# Patient Record
Sex: Male | Born: 1950 | Race: White | Hispanic: No | Marital: Single | Smoking: Former smoker
Health system: Southern US, Community
[De-identification: ages and names within clinical notes are randomized; demographics above are authoritative.]

## PROBLEM LIST (undated history)

## (undated) DIAGNOSIS — K219 Gastro-esophageal reflux disease without esophagitis: Secondary | ICD-10-CM

## (undated) DIAGNOSIS — J439 Emphysema, unspecified: Secondary | ICD-10-CM

## (undated) DIAGNOSIS — M1990 Unspecified osteoarthritis, unspecified site: Secondary | ICD-10-CM

## (undated) DIAGNOSIS — H269 Unspecified cataract: Secondary | ICD-10-CM

## (undated) DIAGNOSIS — J449 Chronic obstructive pulmonary disease, unspecified: Secondary | ICD-10-CM

## (undated) DIAGNOSIS — E039 Hypothyroidism, unspecified: Secondary | ICD-10-CM

## (undated) DIAGNOSIS — T7840XA Allergy, unspecified, initial encounter: Secondary | ICD-10-CM

## (undated) DIAGNOSIS — E785 Hyperlipidemia, unspecified: Secondary | ICD-10-CM

## (undated) HISTORY — DX: Unspecified osteoarthritis, unspecified site: M19.90

## (undated) HISTORY — PX: BRAIN SURGERY: SHX531

## (undated) HISTORY — DX: Chronic obstructive pulmonary disease, unspecified: J44.9

## (undated) HISTORY — DX: Allergy, unspecified, initial encounter: T78.40XA

## (undated) HISTORY — DX: Gastro-esophageal reflux disease without esophagitis: K21.9

## (undated) HISTORY — DX: Hyperlipidemia, unspecified: E78.5

## (undated) HISTORY — DX: Unspecified cataract: H26.9

## (undated) HISTORY — PX: EYE SURGERY: SHX253

## (undated) HISTORY — DX: Emphysema, unspecified: J43.9

## (undated) HISTORY — DX: Hypothyroidism, unspecified: E03.9

---
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
## 2000-04-12 ENCOUNTER — Encounter: Payer: Self-pay | Admitting: Family Medicine

## 2000-04-12 ENCOUNTER — Encounter: Admission: RE | Admit: 2000-04-12 | Discharge: 2000-04-12 | Payer: Self-pay | Admitting: Family Medicine

## 2006-08-21 HISTORY — PX: COLONOSCOPY: SHX174

## 2006-09-10 ENCOUNTER — Ambulatory Visit: Payer: Self-pay | Admitting: Internal Medicine

## 2006-09-17 ENCOUNTER — Ambulatory Visit: Payer: Self-pay | Admitting: Internal Medicine

## 2006-09-17 ENCOUNTER — Encounter: Payer: Self-pay | Admitting: Internal Medicine

## 2006-09-17 DIAGNOSIS — Z87891 Personal history of nicotine dependence: Secondary | ICD-10-CM | POA: Insufficient documentation

## 2006-09-17 DIAGNOSIS — E785 Hyperlipidemia, unspecified: Secondary | ICD-10-CM | POA: Insufficient documentation

## 2006-09-24 ENCOUNTER — Ambulatory Visit: Payer: Self-pay | Admitting: Internal Medicine

## 2006-10-02 ENCOUNTER — Encounter (INDEPENDENT_AMBULATORY_CARE_PROVIDER_SITE_OTHER): Payer: Self-pay | Admitting: Specialist

## 2006-10-02 ENCOUNTER — Encounter: Payer: Self-pay | Admitting: Internal Medicine

## 2006-10-02 ENCOUNTER — Ambulatory Visit: Payer: Self-pay | Admitting: Internal Medicine

## 2006-10-02 LAB — HM COLONOSCOPY

## 2006-11-13 ENCOUNTER — Ambulatory Visit: Payer: Self-pay | Admitting: Internal Medicine

## 2006-11-20 ENCOUNTER — Ambulatory Visit: Payer: Self-pay | Admitting: Internal Medicine

## 2007-05-27 ENCOUNTER — Ambulatory Visit: Payer: Self-pay | Admitting: Internal Medicine

## 2007-05-28 ENCOUNTER — Encounter: Payer: Self-pay | Admitting: Internal Medicine

## 2007-06-03 ENCOUNTER — Ambulatory Visit: Payer: Self-pay | Admitting: Internal Medicine

## 2007-09-16 ENCOUNTER — Encounter: Payer: Self-pay | Admitting: Internal Medicine

## 2007-11-21 ENCOUNTER — Ambulatory Visit: Payer: Self-pay | Admitting: Internal Medicine

## 2007-11-22 ENCOUNTER — Encounter: Payer: Self-pay | Admitting: Internal Medicine

## 2007-12-06 ENCOUNTER — Ambulatory Visit: Payer: Self-pay | Admitting: Internal Medicine

## 2008-11-26 ENCOUNTER — Ambulatory Visit: Payer: Self-pay | Admitting: Internal Medicine

## 2008-11-27 ENCOUNTER — Encounter: Payer: Self-pay | Admitting: Internal Medicine

## 2008-12-07 ENCOUNTER — Ambulatory Visit: Payer: Self-pay | Admitting: Internal Medicine

## 2009-02-18 ENCOUNTER — Encounter: Payer: Self-pay | Admitting: Internal Medicine

## 2009-04-08 ENCOUNTER — Ambulatory Visit: Payer: Self-pay | Admitting: Internal Medicine

## 2009-05-10 LAB — CONVERTED CEMR LAB: TSH: 6.107 microintl units/mL — ABNORMAL HIGH (ref 0.350–4.500)

## 2009-08-09 ENCOUNTER — Ambulatory Visit: Payer: Self-pay | Admitting: Internal Medicine

## 2009-08-10 ENCOUNTER — Encounter: Payer: Self-pay | Admitting: Internal Medicine

## 2009-08-25 ENCOUNTER — Telehealth (INDEPENDENT_AMBULATORY_CARE_PROVIDER_SITE_OTHER): Payer: Self-pay | Admitting: *Deleted

## 2009-12-07 ENCOUNTER — Ambulatory Visit: Payer: Self-pay | Admitting: Internal Medicine

## 2009-12-07 LAB — CONVERTED CEMR LAB
Bilirubin Urine: NEGATIVE
Glucose, Urine, Semiquant: NEGATIVE
Ketones, urine, test strip: NEGATIVE
Nitrite: NEGATIVE
Protein, U semiquant: NEGATIVE
Specific Gravity, Urine: 1.015
Urobilinogen, UA: 0.2
WBC Urine, dipstick: NEGATIVE
pH: 5.5

## 2009-12-08 ENCOUNTER — Encounter: Payer: Self-pay | Admitting: Internal Medicine

## 2009-12-08 LAB — CONVERTED CEMR LAB: PSA: NORMAL ng/mL

## 2009-12-14 ENCOUNTER — Ambulatory Visit: Payer: Self-pay | Admitting: Internal Medicine

## 2010-09-20 NOTE — Miscellaneous (Signed)
Summary: Health Care Power of [place] and Living Will  Health Care Power of [place] and Living Will   Imported By: Maryln Gottron 02/17/2010 11:07:52  _____________________________________________________________________  External Attachment:    Type:   Image     Comment:   External Document

## 2010-09-20 NOTE — Progress Notes (Signed)
Summary: lab results  Phone Note Call from Patient   Caller: Patient Call For: Birdie Sons MD Summary of Call: Pt is calling for last lab results. [phone] Initial call taken by: Lynann Beaver CMA,  August 25, 2009 1:53 PM  Follow-up for Phone Call        Not found in chart.  will ask lab to follow up.Gladis Riffle, RN  August 25, 2009 2:33 PM     They went to [place] and I gave the results to [place].  Appended Document: lab results Patient notified of results and need to increase levothyroxine to 125 micrograms daily.  He wants done to [facility].  Has lab appt in april to which tsh will be added.  Lab appt 4/19/ for cpx labs and tsh added.Rx done electronically.

## 2010-09-20 NOTE — Assessment & Plan Note (Signed)
Summary: cpx//lh   Vital Signs:  Patient profile:   60 year old male Height:      67.5 inches Weight:      144 pounds BMI:     22.30 Pulse rate:   88 / minute Pulse rhythm:   regular Resp:     12 per minute BP sitting:   108 / 72  (left arm) Cuff size:   regular  Vitals Entered By: Gladis Riffle, RN (December 14, 2009 8:54 AM) CC: cpx, labs done--weight loss of 12# in year Is Patient Diabetic? No   CC:  cpx and labs done--weight loss of 12# in year.  History of Present Illness: CPX  Preventive Screening-Counseling & Management  Alcohol-Tobacco     Smoking Status: current     Packs/Day: 0.75  Current Problems (verified): 1)  Physical Examination  (ICD-V70.0) 2)  Tobacco Use  (ICD-305.1) 3)  Hypothyroidism  (ICD-244.9) 4)  Hyperlipidemia  (ICD-272.4)  Current Medications (verified): 1)  Levothyroxine Sodium 125 Mcg Tabs (Levothyroxine Sodium) .... Take 1 Tablet By Mouth Once A Day  Allergies (verified): No Known Drug Allergies  Past History:  Past Medical History: Last updated: 06/03/2007  Hyperlipidemia-lab work at [facility] Hypothyroidism elevated tsh 2008  Past Surgical History: Last updated: 10/03/06 L eye surgery 1959  Family History: Last updated: 10-03-2006 mother deceased-htn pericarditis 85 yo Family History Hypertension father 80 y/o may have had mild MI 2 sisters A&W  Social History: Last updated: Oct 03, 2006 Occupation: Paramedic Current Smoker Alcohol use-no Regular exercise-no  Risk Factors: Exercise: no (October 03, 2006)  Risk Factors: Smoking Status: current (12/14/2009) Packs/Day: 0.75 (12/14/2009)  Social History: Packs/Day:  0.75  Review of Systems       All other systems reviewed and were negative    Impression & Recommendations:  Problem # 1:  PHYSICAL EXAMINATION (ICD-V70.0) health maint UTD reviewed [facility]  Problem # 2:  TOBACCO USE (ICD-305.1)  Encouraged smoking cessation and discussed different  methods for smoking cessation.   Complete Medication List: 1)  Levothyroxine Sodium 125 Mcg Tabs (Levothyroxine sodium) .... Take 1 tablet by mouth once a day  Preventive Care Screening  PSA:    Date:  12/08/2009    Results:  normal-0.6 ng/mL  Physical Exam General Appearance: well developed, well nourished, no acute distress Eyes: conjunctiva and lids normal, PERRL, EOMI, Ears, Nose, Mouth, Throat: TM clear, nares clear, oral exam WNL Neck: supple, no lymphadenopathy, no thyromegaly, no JVD Respiratory: clear to auscultation and percussion, respiratory effort normal Cardiovascular: regular rate and rhythm, S1-S2, no murmur, rub or gallop, no bruits, peripheral pulses normal and symmetric, no cyanosis, clubbing, edema or varicosities Chest: no scars, masses, tenderness; no asymmetry, skin changes, nipple discharge,    Gastrointestinal: soft, non-tender; no hepatosplenomegaly, masses; active bowel sounds all quadrants, guaiac negative stool; no masses, tenderness, hemorrhoids  Genitourinary: no hernia, testicular mass,  or prostate enlargement Lymphatic: no cervical, axillary or inguinal adenopathy Musculoskeletal: gait normal, muscle tone and strength WNL, no joint swelling, effusions, discoloration, crepitus  Skin: clear, good turgor, color WNL, no rashes, lesions, or ulcerations Neurologic: normal mental status, normal reflexes, normal strength, sensation, and motion Psychiatric: alert; oriented to person, place and time Other Exam:

## 2010-12-19 ENCOUNTER — Other Ambulatory Visit (INDEPENDENT_AMBULATORY_CARE_PROVIDER_SITE_OTHER): Payer: Self-pay | Admitting: Internal Medicine

## 2010-12-19 DIAGNOSIS — Z0000 Encounter for general adult medical examination without abnormal findings: Secondary | ICD-10-CM

## 2010-12-19 LAB — POCT URINALYSIS DIPSTICK
Glucose, UA: NEGATIVE
Leukocytes, UA: NEGATIVE
Nitrite, UA: NEGATIVE
Protein, UA: NEGATIVE
Spec Grav, UA: 1.005
Urobilinogen, UA: 0.2

## 2010-12-20 LAB — LIPID PANEL: Triglycerides: 175 mg/dL — AB (ref 40–160)

## 2010-12-23 ENCOUNTER — Encounter: Payer: Self-pay | Admitting: Internal Medicine

## 2010-12-26 ENCOUNTER — Encounter: Payer: Self-pay | Admitting: Internal Medicine

## 2010-12-26 ENCOUNTER — Ambulatory Visit (INDEPENDENT_AMBULATORY_CARE_PROVIDER_SITE_OTHER): Payer: PRIVATE HEALTH INSURANCE | Admitting: Internal Medicine

## 2010-12-26 VITALS — BP 122/76 | HR 84 | Temp 98.0°F | Ht 67.5 in | Wt 152.0 lb

## 2010-12-26 DIAGNOSIS — F17200 Nicotine dependence, unspecified, uncomplicated: Secondary | ICD-10-CM

## 2010-12-26 DIAGNOSIS — E785 Hyperlipidemia, unspecified: Secondary | ICD-10-CM

## 2010-12-26 DIAGNOSIS — Z0000 Encounter for general adult medical examination without abnormal findings: Secondary | ICD-10-CM

## 2010-12-26 DIAGNOSIS — E039 Hypothyroidism, unspecified: Secondary | ICD-10-CM

## 2010-12-26 MED ORDER — LEVOTHYROXINE SODIUM 150 MCG PO TABS: 125.0000 ug | ORAL_TABLET | Freq: Every day | ORAL | Status: DC

## 2010-12-26 NOTE — Assessment & Plan Note (Signed)
Note tsh Increase meds See new list

## 2010-12-26 NOTE — Progress Notes (Signed)
  Subjective:    Patient ID: Joseph Ford, male    DOB: [birth year], 60 y.o.   MRN: [id number]  HPI  cpx  Past Medical History  Diagnosis Date  . Hyperlipidemia   . Hypothyroidism    Past Surgical History  Procedure Date  . Left eye surgery 1959    reports that he has been smoking Cigarettes.  He has been smoking about .5 packs per day. He does not have any smokeless tobacco history on file. He reports that he drinks about 1.2 ounces of alcohol per week. He reports that he does not use illicit drugs. family history includes Cancer in his sister; Diabetes in his maternal aunt and maternal grandmother; Heart attack in his father; Heart disease in his mother; Heart disease (age of onset:42) in his father; and Hypertension in his mother. No Known Allergies   Review of Systems  patient denies chest pain, shortness of breath, orthopnea. Denies lower extremity edema, abdominal pain, change in appetite, change in bowel movements. Patient denies rashes, musculoskeletal complaints. No other specific complaints in a complete review of systems.      Objective:   Physical Exam Well-developed male in no acute distress. HEENT exam atraumatic, normocephalic, extraocular muscles are intact. Conjunctivae are pink without exudate. Neck is supple without lymphadenopathy, thyromegaly, jugular venous distention. Chest is clear to auscultation without increased work of breathing. Cardiac exam S1-S2 are regular. The PMI is normal. No significant murmurs or gallops. Abdominal exam active bowel sounds, soft, nontender. No abdominal bruits. Extremities no clubbing cyanosis or edema. Peripheral pulses are normal without bruits. Neurologic exam alert and oriented without any motor or sensory deficits. Rectal exam normal tone prostate normal size without masses or asymmetry.        Assessment & Plan:  Well visit Health maint UTD Advised smoking cessation

## 2010-12-26 NOTE — Assessment & Plan Note (Signed)
He understands need to quit He is not interested at this time Information given to patient and reviewed

## 2010-12-26 NOTE — Patient Instructions (Addendum)
Smoking Cessation This document explains the best ways for you to quit smoking and new treatments to help. It lists new medicines that can double or triple your chances of quitting and quitting for good. It also considers ways to avoid relapses and concerns you may have about quitting, including weight gain. NICOTINE: A POWERFUL ADDICTION If you have tried to quit smoking, you know how hard it can be. It is hard because nicotine is a very addictive drug. For some people, it can be as addictive as heroin or cocaine. Usually, people make 2 or 3 tries, or more, before finally being able to quit. Each time you try to quit, you can learn about what helps and what hurts. Quitting takes hard work and a lot of effort, but you can quit smoking. QUITTING SMOKING IS ONE OF THE MOST IMPORTANT THINGS YOU WILL EVER DO:  You will live longer, feel better, and live better.   The impact on your body of quitting smoking is felt almost immediately:   Within 20 minutes, blood pressure decreases. Pulse returns to its normal level.   After 8 hours, carbon monoxide levels in the blood return to normal. Oxygen level increases.   After 24 hours, chance of heart attack starts to decrease. Breath, hair, and body stop smelling like smoke.   After 48 hours, damaged nerve endings begin to recover. Sense of taste and smell improve.   After 72 hours, the body is virtually free of nicotine. Bronchial tubes relax and breathing becomes easier.   After 2 to 12 weeks, lungs can hold more air. Exercise becomes easier and circulation improves.   Quitting will lower your chance of having a heart attack, stroke, cancer, or lung disease:   After 1 year, the risk of coronary heart disease is cut in half.   After 5 years, the risk of stroke falls to the same as a nonsmoker.   After 10 years, the risk of lung cancer is cut in half and the risk of other cancers decreases significantly.   After 15 years, the risk of coronary heart  disease drops, usually to the level of a nonsmoker.   If you are pregnant, quitting smoking will improve your chances of having a healthy baby.   The people you live with, especially your children, will be healthier.   You will have extra money to spend on things other than cigarettes.  FIVE KEYS TO QUITTING Studies have shown that these 5 steps will help you quit smoking and quit for good. You have the best chances of quitting if you use them together: 1. Get ready.  2. Get support and encouragement.  3. Learn new skills and behaviors.  4. Get medicine to reduce your nicotine addiction and use it correctly.  5. Be prepared for relapse or difficult situations. Be determined to continue trying to quit, even if you do not succeed at first.  1. GET READY  Set a quit date.   Change your environment.   Get rid of ALL cigarettes, ashtrays, matches, and lighters in your home, car, and place of work.   Do not let people smoke in your home.   Review your past attempts to quit. Think about what worked and what did not.   Once you quit, do not smoke. NOT EVEN A PUFF!  2. GET SUPPORT AND ENCOURAGEMENT Studies have shown that you have a better chance of being successful if you have help. You can get support in many ways.  Tell  your family, friends, and coworkers that you are going to quit and need their support. Ask them not to smoke around you.   Talk to your caregivers (doctor, dentist, nurse, pharmacist, psychologist, and/or smoking counselor).   Get individual, group, or telephone counseling and support. The more counseling you have, the better your chances are of quitting. Programs are available at local hospitals and health centers. Call your local health department for information about programs in your area.   Spiritual beliefs and practices may help some smokers quit.   Quit meters are small computer programs online or downloadable that keep track of quit statistics, such as amount  of "quit-time," cigarettes not smoked, and money saved.   Many smokers find one or more of the many self-help books available useful in helping them quit and stay off tobacco.  3. LEARN NEW SKILLS AND BEHAVIORS  Try to distract yourself from urges to smoke. Talk to someone, go for a walk, or occupy your time with a task.   When you first try to quit, change your routine. Take a different route to work. Drink tea instead of coffee. Eat breakfast in a different place.   Do something to reduce your stress. Take a hot bath, exercise, or read a book.   Plan something enjoyable to do every day. Reward yourself for not smoking.   Explore interactive web-based programs that specialize in helping you quit.  4. GET MEDICINE AND USE IT CORRECTLY Medicines can help you stop smoking and decrease the urge to smoke. Combining medicine with the above behavioral methods and support can quadruple your chances of successfully quitting smoking. The U.S. Food and Drug Administration (FDA) has approved 7 medicines to help you quit smoking. These medicines fall into 3 categories.  Nicotine replacement therapy (delivers nicotine to your body without the negative effects and risks of smoking):   Nicotine gum: Available over-the-counter.   Nicotine lozenges: Available over-the-counter.   Nicotine inhaler: Available by prescription.   Nicotine nasal spray: Available by prescription.   Nicotine skin patches (transdermal): Available by prescription and over-the-counter.   Antidepressant medicine (helps people abstain from smoking, but how this works is unknown):   Bupropion sustained-release (SR) tablets: Available by prescription.   Nicotinic receptor partial agonist (simulates the effect of nicotine in your brain):   Varenicline tartrate tablets: Available by prescription.   Ask your caregiver for advice about which medicines to use and how to use them. Carefully read the information on the package.    Everyone who is trying to quit may benefit from using a medicine. If you are pregnant or trying to become pregnant, nursing an infant, you are under age 18, or you smoke fewer than 10 cigarettes per day, talk to your caregiver before taking any nicotine replacement medicines.   You should stop using a nicotine replacement product and call your caregiver if you experience nausea, dizziness, weakness, vomiting, fast or irregular heartbeat, mouth problems with the lozenge or gum, or redness or swelling of the skin around the patch that does not go away.   Do not use any other product containing nicotine while using a nicotine replacement product.   Talk to your caregiver before using these products if you have diabetes, heart disease, asthma, stomach ulcers, you had a recent heart attack, you have high blood pressure that is not controlled with medicine, a history of irregular heartbeat, or you have been prescribed medicine to help you quit smoking.  5. BE PREPARED FOR RELAPSE OR   DIFFICULT SITUATIONS  Most relapses occur within the first 3 months after quitting. Do not be discouraged if you start smoking again. Remember, most people try several times before they finally quit.   You may have symptoms of withdrawal because your body is used to nicotine. You may crave cigarettes, be irritable, feel very hungry, cough often, get headaches, or have difficulty concentrating.   The withdrawal symptoms are only temporary. They are strongest when you first quit, but they will go away within 10 to 14 days.  Here are some difficult situations to watch for:  Alcohol. Avoid drinking alcohol. Drinking lowers your chances of successfully quitting.   Caffeine. Try to reduce the amount of caffeine you consume. It also lowers your chances of successfully quitting.   Other smokers. Being around smoking can make you want to smoke. Avoid smokers.   Weight gain. Many smokers will gain weight when they quit, usually  less than 10 pounds. Eat a healthy diet and stay active. Do not let weight gain distract you from your main goal, quitting smoking. Some medicines that help you quit smoking may also help delay weight gain. You can always lose the weight gained after you quit.   Bad mood or depression. There are a lot of ways to improve your mood other than smoking.  If you are having problems with any of these situations, talk to your caregiver. SPECIAL SITUATIONS OR CONDITIONS Studies suggest that everyone can quit smoking. Your situation or condition can give you a special reason to quit.  Pregnant women/New mothers: By quitting, you protect your baby's health and your own.   Hospitalized patients: By quitting, you reduce health problems and help healing.   Heart attack patients: By quitting, you reduce your risk of a second heart attack.   Lung, head, and neck cancer patients: By quitting, you reduce your chance of a second cancer.   Parents of children and adolescents: By quitting, you protect your children from illnesses caused by secondhand smoke.  QUESTIONS TO THINK ABOUT Think about the following questions before you try to stop smoking. You may want to talk about your answers with your caregiver.  Why do you want to quit?   If you tried to quit in the past, what helped and what did not?   What will be the most difficult situations for you after you quit? How will you plan to handle them?   Who can help you through the tough times? Your family? Friends? Caregiver?   What pleasures do you get from smoking? What ways can you still get pleasure if you quit?  Here are some questions to ask your caregiver:  How can you help me to be successful at quitting?   What medicine do you think would be best for me and how should I take it?   What should I do if I need more help?   What is smoking withdrawal like? How can I get information on withdrawal?  Quitting takes hard work and a lot of effort,  but you can quit smoking. FOR MORE INFORMATION Smokefree.gov (http://www.davis-sullivan.com/) provides free, accurate, evidence-based information and professional assistance to help support the immediate and long-term needs of people trying to quit smoking. Document Released: 08/01/2001 Document Re-Released: 01/25/2010 [facility] Patient Information 2011 [place], [place].Cholesterol Cholesterol is a white, waxy, fat-like protein needed by your body in small amounts. The liver makes all the cholesterol you need. It is carried from the liver by the blood through the blood vessels.  Deposits (plaque) may build up on blood vessel walls. This makes the arteries narrower and stiffer. Plaque increases the risk for heart attack and stroke. You cannot feel your cholesterol level even if it is very high. The only way to know is by a blood test to check your lipid (fats) levels. Once you know your cholesterol levels, you should keep a record of the test results. Work with your caregiver to to keep your levels in the desired range. WHAT THE RESULTS MEAN:  Total cholesterol is a rough measure of all the cholesterol in your blood.   LDL is the so-called bad cholesterol. This is the type that deposits cholesterol   in the walls of the arteries. You want this level to be low.   HDL is the good cholesterol because it cleans the arteries and carries the LDL away. You want this level to be high.   Triglycerides are fat that the body can either burn for energy or store. High levels are closely linked to heart disease.  DESIRED LEVELS:  Total cholesterol below 200.   LDL below 100 for people at risk, below 70 for very high risk.   HDL above 50 is good, above 60 is best.   Triglycerides below 150.  HOW TO LOWER YOUR CHOLESTEROL:  Diet.   Choose fish or white meat chicken and [place], roasted or baked. Limit fatty cuts of red meat, fried foods, and processed meats, such as sausage and lunch meat.   Eat lots of fresh  fruits and vegetables. Choose whole grains, beans, pasta, potatoes and cereals.   Use only small amounts of olive, corn or canola oils. Avoid butter, mayonnaise, shortening or palm kernel oils. Avoid foods with trans-fats.   Use skim/nonfat milk and low-fat/nonfat yogurt and cheeses. Avoid whole milk, cream, ice cream, egg yolks and cheeses. Healthy desserts include angel food cake, gingersnaps, animal crackers, hard candy, popsicles, and low-fat/nonfat frozen yogurt. Avoid pastries, cakes, pies and cookies.   Exercise.   A regular program helps decrease LDL and raises HDL.   Helps with weight control.   Do things that increase your activity level like gardening, walking, or taking the stairs.   Medication.   May be prescribed by your caregiver to help lowering cholesterol and the risk for heart disease.   You may need medicine even if your levels are normal if you have several risk factors.  HOME CARE INSTRUCTIONS  Follow your diet and exercise programs as suggested by your caregiver.   Take medications as directed.   Have blood work done when your caregiver feels it is necessary.  MAKE SURE YOU:   Understand these instructions.   Will watch your condition.   Will get help right away if you are not doing well or get worse.  Document Released: 05/02/2001 Document Re-Released: 07/20/2008 [facility] Patient Information 2011 [place], [place].

## 2011-01-02 ENCOUNTER — Encounter: Payer: Self-pay | Admitting: Internal Medicine

## 2011-01-10 ENCOUNTER — Encounter: Payer: Self-pay | Admitting: Internal Medicine

## 2011-04-25 ENCOUNTER — Other Ambulatory Visit: Payer: PRIVATE HEALTH INSURANCE

## 2011-04-25 DIAGNOSIS — E785 Hyperlipidemia, unspecified: Secondary | ICD-10-CM

## 2011-04-25 DIAGNOSIS — E039 Hypothyroidism, unspecified: Secondary | ICD-10-CM

## 2011-05-02 ENCOUNTER — Ambulatory Visit: Payer: PRIVATE HEALTH INSURANCE | Admitting: Internal Medicine

## 2011-05-08 ENCOUNTER — Ambulatory Visit: Payer: PRIVATE HEALTH INSURANCE | Admitting: Internal Medicine

## 2011-05-09 ENCOUNTER — Ambulatory Visit (INDEPENDENT_AMBULATORY_CARE_PROVIDER_SITE_OTHER): Payer: PRIVATE HEALTH INSURANCE | Admitting: Internal Medicine

## 2011-05-09 ENCOUNTER — Encounter: Payer: Self-pay | Admitting: Internal Medicine

## 2011-05-09 DIAGNOSIS — E785 Hyperlipidemia, unspecified: Secondary | ICD-10-CM

## 2011-05-09 DIAGNOSIS — E039 Hypothyroidism, unspecified: Secondary | ICD-10-CM

## 2011-05-17 NOTE — Assessment & Plan Note (Signed)
Resists treatment

## 2011-05-17 NOTE — Assessment & Plan Note (Signed)
Will increase meds Continue same

## 2011-05-17 NOTE — Progress Notes (Signed)
  Subjective:    Patient ID: Joseph Ford, male    DOB: 1950/12/11, 60 y.o.   MRN: [id number]  HPI  Patient Active Problem List  Diagnoses  . HYPOTHYROIDISM---tolerating replacement therapy  . HYPERLIPIDEMIA---no meds at this time  . TOBACCO USE----not interested in quitting   Past Medical History  Diagnosis Date  . Hyperlipidemia   . Hypothyroidism    Past Surgical History  Procedure Date  . Left eye surgery 1959    reports that he has been smoking Cigarettes.  He has been smoking about .5 packs per day. He does not have any smokeless tobacco history on file. He reports that he drinks about 1.2 ounces of alcohol per week. He reports that he does not use illicit drugs. family history includes Cancer in his sister; Diabetes in his maternal aunt and maternal grandmother; Heart attack in his father; Heart disease in his mother; Heart disease (age of onset:42) in his father; and Hypertension in his mother. No Known Allergies  Review of Systems  patient denies chest pain, shortness of breath, orthopnea. Denies lower extremity edema, abdominal pain, change in appetite, change in bowel movements. Patient denies rashes, musculoskeletal complaints. No other specific complaints in a complete review of systems.      Objective:   Physical Exam   well-developed well-nourished male in no acute distress. HEENT exam atraumatic, normocephalic, neck supple without jugular venous distention. Chest clear to auscultation cardiac exam S1-S2 are regular. Abdominal exam overweight with bowel sounds, soft and nontender. Extremities no edema. Neurologic exam is alert with a normal gait.       Assessment & Plan:

## 2011-12-25 ENCOUNTER — Telehealth: Payer: Self-pay | Admitting: Internal Medicine

## 2011-12-25 DIAGNOSIS — E039 Hypothyroidism, unspecified: Secondary | ICD-10-CM

## 2011-12-25 MED ORDER — LEVOTHYROXINE SODIUM 150 MCG PO TABS: 125.0000 ug | ORAL_TABLET | Freq: Every day | ORAL | Status: DC

## 2011-12-25 NOTE — Telephone Encounter (Signed)
rx sent in electronically 

## 2011-12-25 NOTE — Telephone Encounter (Signed)
Upon scheduling his cpx patient informed me that he will run out of his levothyroxine rx before his cpx 01/24/12. Please assist.

## 2012-01-17 ENCOUNTER — Other Ambulatory Visit (INDEPENDENT_AMBULATORY_CARE_PROVIDER_SITE_OTHER): Payer: PRIVATE HEALTH INSURANCE

## 2012-01-17 DIAGNOSIS — Z0000 Encounter for general adult medical examination without abnormal findings: Secondary | ICD-10-CM

## 2012-01-18 ENCOUNTER — Telehealth: Payer: Self-pay | Admitting: *Deleted

## 2012-01-18 LAB — LIPID PANEL
LDL Cholesterol: 168 mg/dL
Triglycerides: 108 mg/dL (ref 40–160)

## 2012-01-18 NOTE — Telephone Encounter (Signed)
They received urine cup for urinalysis but states it is not the appropriate collection. Please call and let them know what they should do? Reference  # [id number] L.

## 2012-01-18 NOTE — Telephone Encounter (Signed)
[facility] is going to send urine back for [place] to run a u/a on.

## 2012-01-19 LAB — POCT URINALYSIS DIPSTICK
Ketones, UA: NEGATIVE
Protein, UA: NEGATIVE
Spec Grav, UA: <=1.005

## 2012-01-23 ENCOUNTER — Telehealth: Payer: Self-pay | Admitting: Internal Medicine

## 2012-01-23 NOTE — Telephone Encounter (Signed)
Pt called and said that he is coming in to see Dr Cato Mulligan tomorrow morning and needs the nurse to pull the lab work pt had done at [facility] last wk.

## 2012-01-23 NOTE — Telephone Encounter (Signed)
Labs were faxed to [place]

## 2012-01-24 ENCOUNTER — Encounter: Payer: Self-pay | Admitting: Internal Medicine

## 2012-01-24 ENCOUNTER — Ambulatory Visit (INDEPENDENT_AMBULATORY_CARE_PROVIDER_SITE_OTHER): Payer: PRIVATE HEALTH INSURANCE | Admitting: Internal Medicine

## 2012-01-24 VITALS — BP 110/74 | HR 88 | Temp 97.9°F | Ht 67.0 in | Wt 150.0 lb

## 2012-01-24 DIAGNOSIS — E785 Hyperlipidemia, unspecified: Secondary | ICD-10-CM

## 2012-01-24 MED ORDER — ATORVASTATIN CALCIUM 20 MG PO TABS: 20.0000 mg | ORAL_TABLET | Freq: Every day | ORAL | Status: DC

## 2012-01-24 NOTE — Assessment & Plan Note (Signed)
Not controlled and he has multiple risk factors: age, smoker, Fhx Start statin

## 2012-01-24 NOTE — Progress Notes (Signed)
  Subjective:    Patient ID: Joseph Ford, male    DOB: [birth year], 61 y.o.   MRN: [id number]  HPI  cpx  Past Medical History  Diagnosis Date  . Hyperlipidemia   . Hypothyroidism     History   Social History  . Marital Status: Single    Spouse Name: N/A    Number of Children: N/A  . Years of Education: N/A   Occupational History  . Not on file.   Social History Main Topics  . Smoking status: Current Everyday Smoker -- 0.5 packs/day    Types: Cigarettes  . Smokeless tobacco: Not on file  . Alcohol Use: 1.2 oz/week    2 Glasses of wine per week  . Drug Use: No  . Sexually Active: Not on file   Other Topics Concern  . Not on file   Social History Narrative  . No narrative on file    Past Surgical History  Procedure Date  . Left eye surgery 1959    Family History  Problem Relation Age of Onset  . Hypertension Mother   . Heart disease Mother   . Heart attack Father   . Heart disease Father 36    "mild heart attack"  . Diabetes Maternal Aunt   . Diabetes Maternal Grandmother   . Cancer Sister     breast    No Known Allergies  Current Outpatient Prescriptions on File Prior to Visit  Medication Sig Dispense Refill  . levothyroxine (SYNTHROID, LEVOTHROID) 150 MCG tablet Take 1 tablet (150 mcg total) by mouth daily.  90 tablet  0  . atorvastatin (LIPITOR) 20 MG tablet Take 1 tablet (20 mg total) by mouth daily.  90 tablet  3     patient denies chest pain, shortness of breath, orthopnea. Denies lower extremity edema, abdominal pain, change in appetite, change in bowel movements. Patient denies rashes, musculoskeletal complaints. No other specific complaints in a complete review of systems.   BP 110/74  Pulse 88  Temp(Src) 97.9 F (36.6 C) (Oral)  Ht 5\' 7"  (1.702 m)  Wt 150 lb (68.04 kg)  BMI 23.49 kg/m2   Review of Systems     Objective:   Physical Exam Well-developed male in no acute distress. HEENT exam atraumatic, normocephalic, extraocular  muscles are intact. Conjunctivae are pink without exudate. Neck is supple without lymphadenopathy, thyromegaly, jugular venous distention. Chest is clear to auscultation without increased work of breathing. Cardiac exam S1-S2 are regular. The PMI is normal. No significant murmurs or gallops. Abdominal exam active bowel sounds, soft, nontender. No abdominal bruits. Extremities no clubbing cyanosis or edema. Peripheral pulses are normal without bruits. Neurologic exam alert and oriented without any motor or sensory deficits. Rectal exam normal tone prostate normal size without masses or asymmetry.        Assessment & Plan:  Well visit: health maint UTD

## 2012-01-25 ENCOUNTER — Encounter: Payer: Self-pay | Admitting: Internal Medicine

## 2012-03-21 ENCOUNTER — Other Ambulatory Visit: Payer: Self-pay | Admitting: Internal Medicine

## 2012-04-25 ENCOUNTER — Other Ambulatory Visit (INDEPENDENT_AMBULATORY_CARE_PROVIDER_SITE_OTHER): Payer: PRIVATE HEALTH INSURANCE

## 2012-04-25 DIAGNOSIS — E785 Hyperlipidemia, unspecified: Secondary | ICD-10-CM

## 2012-05-17 ENCOUNTER — Encounter: Payer: Self-pay | Admitting: Internal Medicine

## 2012-09-16 ENCOUNTER — Other Ambulatory Visit: Payer: Self-pay | Admitting: Internal Medicine

## 2012-10-05 ENCOUNTER — Other Ambulatory Visit: Payer: Self-pay

## 2012-12-16 ENCOUNTER — Other Ambulatory Visit: Payer: Self-pay | Admitting: Internal Medicine

## 2013-01-13 ENCOUNTER — Other Ambulatory Visit: Payer: Self-pay | Admitting: Internal Medicine

## 2013-01-15 ENCOUNTER — Encounter: Payer: Self-pay | Admitting: Internal Medicine

## 2013-03-20 ENCOUNTER — Other Ambulatory Visit: Payer: Self-pay | Admitting: Internal Medicine

## 2013-03-20 MED ORDER — LEVOTHYROXINE SODIUM 150 MCG PO TABS: ORAL_TABLET | ORAL | Status: DC

## 2013-03-28 ENCOUNTER — Other Ambulatory Visit (INDEPENDENT_AMBULATORY_CARE_PROVIDER_SITE_OTHER): Payer: PRIVATE HEALTH INSURANCE

## 2013-03-28 DIAGNOSIS — Z0000 Encounter for general adult medical examination without abnormal findings: Secondary | ICD-10-CM

## 2013-03-28 LAB — POCT URINALYSIS DIPSTICK
Ketones, UA: NEGATIVE
Protein, UA: NEGATIVE
Spec Grav, UA: 1.02
Urobilinogen, UA: 0.2
pH, UA: 5.5

## 2013-03-28 LAB — BASIC METABOLIC PANEL: Glucose: 101 mg/dL

## 2013-03-28 LAB — TSH: TSH: 0.56 u[IU]/mL (ref <=5.90)

## 2013-03-28 LAB — LIPID PANEL
HDL: 44 mg/dL (ref 35–70)
LDL Cholesterol: 74 mg/dL

## 2013-04-04 ENCOUNTER — Ambulatory Visit (INDEPENDENT_AMBULATORY_CARE_PROVIDER_SITE_OTHER): Payer: PRIVATE HEALTH INSURANCE | Admitting: Internal Medicine

## 2013-04-04 ENCOUNTER — Encounter: Payer: Self-pay | Admitting: Internal Medicine

## 2013-04-04 VITALS — BP 104/68 | HR 88 | Temp 98.0°F | Ht 67.75 in | Wt 158.0 lb

## 2013-04-04 DIAGNOSIS — Z2911 Encounter for prophylactic immunotherapy for respiratory syncytial virus (RSV): Secondary | ICD-10-CM

## 2013-04-04 DIAGNOSIS — Z23 Encounter for immunization: Secondary | ICD-10-CM

## 2013-04-04 DIAGNOSIS — Z0000 Encounter for general adult medical examination without abnormal findings: Secondary | ICD-10-CM

## 2013-04-04 NOTE — Progress Notes (Signed)
cpx  Past Medical History  Diagnosis Date  . Hyperlipidemia   . Hypothyroidism     History   Social History  . Marital Status: Single    Spouse Name: N/A    Number of Children: N/A  . Years of Education: N/A   Occupational History  . Not on file.   Social History Main Topics  . Smoking status: Current Every Day Smoker -- 0.50 packs/day    Types: Cigarettes  . Smokeless tobacco: Not on file  . Alcohol Use: 1.2 oz/week    2 Glasses of wine per week  . Drug Use: No  . Sexual Activity: Not on file   Other Topics Concern  . Not on file   Social History Narrative  . No narrative on file    Past Surgical History  Procedure Laterality Date  . Left eye surgery  1959    Family History  Problem Relation Age of Onset  . Hypertension Mother   . Heart disease Mother   . Heart attack Father   . Heart disease Father 70    "mild heart attack"  . Diabetes Maternal Aunt   . Diabetes Maternal Grandmother   . Cancer Sister     breast    No Known Allergies  Current Outpatient Prescriptions on File Prior to Visit  Medication Sig Dispense Refill  . atorvastatin (LIPITOR) 20 MG tablet TAKE ONE TABLET BY MOUTH EVERY DAY  90 tablet  0  . levothyroxine (SYNTHROID, LEVOTHROID) 150 MCG tablet TAKE ONE TABLET BY MOUTH EVERY DAY.  30 tablet  0   No current facility-administered medications on file prior to visit.     patient denies chest pain, shortness of breath, orthopnea. Denies lower extremity edema, abdominal pain, change in appetite, change in bowel movements. Patient denies rashes, musculoskeletal complaints. No other specific complaints in a complete review of systems.   Reviewed vitals  well-developed well-nourished male in no acute distress. HEENT exam atraumatic, normocephalic, neck supple without jugular venous distention. Chest clear to auscultation cardiac exam S1-S2 are regular. Abdominal exam overweight with bowel sounds, soft and nontender. Extremities no edema.  Neurologic exam is alert with a normal gait.  Well Visit- health maint utd Lipids and tsh well controlled

## 2013-04-11 ENCOUNTER — Encounter: Payer: PRIVATE HEALTH INSURANCE | Admitting: Internal Medicine

## 2013-04-16 ENCOUNTER — Other Ambulatory Visit: Payer: Self-pay | Admitting: Internal Medicine

## 2013-04-16 MED ORDER — ATORVASTATIN CALCIUM 20 MG PO TABS: ORAL_TABLET | ORAL | Status: DC

## 2013-04-16 MED ORDER — LEVOTHYROXINE SODIUM 150 MCG PO TABS: ORAL_TABLET | ORAL | Status: DC

## 2013-06-26 ENCOUNTER — Other Ambulatory Visit: Payer: Self-pay

## 2013-09-16 ENCOUNTER — Encounter: Payer: Self-pay | Admitting: Internal Medicine

## 2014-03-12 ENCOUNTER — Encounter: Payer: Self-pay | Admitting: Internal Medicine

## 2014-03-15 MED ORDER — ATORVASTATIN CALCIUM 20 MG PO TABS: 10.0000 mg | ORAL_TABLET | Freq: Every day | ORAL | Status: DC

## 2014-03-15 NOTE — Telephone Encounter (Signed)
Ok to decrease atorvastatin to 10 mg daily

## 2014-04-22 ENCOUNTER — Other Ambulatory Visit: Payer: Self-pay | Admitting: Internal Medicine

## 2014-05-11 ENCOUNTER — Encounter: Payer: Self-pay | Admitting: Family Medicine

## 2014-05-11 ENCOUNTER — Ambulatory Visit (INDEPENDENT_AMBULATORY_CARE_PROVIDER_SITE_OTHER): Payer: PRIVATE HEALTH INSURANCE | Admitting: Family Medicine

## 2014-05-11 VITALS — BP 100/70 | HR 80 | Temp 98.0°F | Wt 162.0 lb

## 2014-05-11 DIAGNOSIS — E039 Hypothyroidism, unspecified: Secondary | ICD-10-CM

## 2014-05-11 DIAGNOSIS — F17200 Nicotine dependence, unspecified, uncomplicated: Secondary | ICD-10-CM

## 2014-05-11 DIAGNOSIS — Z23 Encounter for immunization: Secondary | ICD-10-CM

## 2014-05-11 DIAGNOSIS — E785 Hyperlipidemia, unspecified: Secondary | ICD-10-CM

## 2014-05-11 NOTE — Assessment & Plan Note (Signed)
Advised strongly of need for cessation. Patient not currently interested. 1/2 ppd.

## 2014-05-11 NOTE — Assessment & Plan Note (Addendum)
Well controlled previously. Decreased atorvastatin to 1/2 of 20mg  pill by phone several months ago. Recheck lipids today.

## 2014-05-11 NOTE — Patient Instructions (Addendum)
[place] Maintenance Due  Topic Date Due  . Influenza Vaccine  03/21/2014   Labs today  You opted to check prostate every 2 years.   Start a baby aspirin.   Strongly encourage you to quit smoking.

## 2014-05-11 NOTE — Progress Notes (Signed)
Joseph Reddish, MD Phone: [phone]  Subjective:  Patient presents today to establish care with me as their new primary care provider. Patient was formerly a patient of Dr. Leanne Chang. Chief complaint-noted.   Hyperlipidemia-well controlled  Lab Results  Component Value Date   LDLCALC 74 03/28/2013  On statin: atorvastatin 20mg -takes 1/2 a pill once a day Regular exercise: advised, no Diet: good choices ROS- no chest pain or shortness of breath. Slight soreness in low back muscles which improved on lower dose  Hypothyroidism-well controlled  On thyroid medication-levothyroxine 164mcg ROS-No hair or nail changes. No heat/cold intolerance. No constipation or diarrhea. Denies shakiness or anxiety. No palpitations. Gained 4 lbs in a year  Tobacco abuse-poor controlled 1/2 PPD. Contemplative. Quit in 59 for short period of time while still working. Had been to smoking cessation class.  ROS- admits occasional mild shortness of breath, thinks allergy symptoms worse while smoking  The following were reviewed and entered/updated in epic: Past Medical History  Diagnosis Date  . Hyperlipidemia   . Hypothyroidism    Patient Active Problem List   Diagnosis Date Noted  . TOBACCO USE 09/17/2006    Priority: High  . HYPOTHYROIDISM 09/17/2006    Priority: Medium  . HYPERLIPIDEMIA 09/17/2006    Priority: Medium   Past Surgical History  Procedure Laterality Date  . Left eye surgery  1959    at [facility] eyes    Family History  Problem Relation Age of Onset  . Hypertension Mother   . Heart disease Mother 40    died of heart attack  . Heart attack Father   . Heart disease Father 11    "mild heart attack"  . Diabetes Maternal Aunt   . Diabetes Maternal Grandmother   . Cancer Sister     breast  . Thyroid disease Sister     unremoved, unclear reason    Medications- reviewed and updated Current Outpatient Prescriptions  Medication Sig Dispense Refill  . atorvastatin (LIPITOR)  20 MG tablet Take 0.5 tablets (10 mg total) by mouth daily. TAKE ONE TABLET BY MOUTH EVERY DAY  90 tablet  3  . levothyroxine (SYNTHROID, LEVOTHROID) 150 MCG tablet TAKE ONE TABLET BY MOUTH ONCE DAILY  90 tablet  0   No current facility-administered medications for this visit.    Allergies-reviewed and updated No Known Allergies  History   Social History  . Marital Status: Single    Spouse Name: N/A    Number of Children: N/A  . Years of Education: N/A   Social History Main Topics  . Smoking status: Current Every Day Smoker -- 0.50 packs/day    Types: Cigarettes  . Smokeless tobacco: None  . Alcohol Use: 0.6 oz/week    1 Glasses of wine per week  . Drug Use: No  . Sexual Activity: None   Other Topics Concern  . None   Social History Narrative   Divorced. No children.    Lives with sister and husband who live in his basement. Has another sister that lives with him in his house.       Retired from post office.       Hobbies: yardwork, Architect work             ROS--See HPI   Objective: BP 100/70  Pulse 80  Temp(Src) 98 F (36.7 C)  Wt 162 lb (73.483 kg) Gen: NAD, resting comfortably in chair HEENT: Mucous membranes are moist. Oropharynx normal Neck: no thyromegaly CV: RRR no murmurs rubs or  gallops Lungs: CTAB no crackles, wheeze, rhonchi Abdomen: soft/nontender/nondistended/normal bowel sounds. No rebound or guarding.  Ext: no edema, 2+ DP pulses Skin: warm, dry, no AK noted (patient bald and advised regular hat/sunscreen which he already uses) Neuro: grossly normal, moves all extremities, PERRLA  Assessment/Plan:  HYPERLIPIDEMIA Well controlled previously. Decreased atorvastatin to 1/2 of 20mg  pill by phone several months ago. Recheck lipids today.   HYPOTHYROIDISM Well controlled previously. Check tsh today, continue levothyroxine 139mcg.   TOBACCO USE Advised strongly of need for cessation. Patient not currently interested. 1/2 ppd.    Baby  ASA-advised to start given HLD and tobacco abuse.  Patient opts for q 2 year prostate DRE and PSA  nonfasting Orders Placed This Encounter  Procedures  . TSH    [place]  . LDL cholesterol, direct    [place]  . Comprehensive metabolic panel    [place]

## 2014-05-11 NOTE — Assessment & Plan Note (Signed)
Well controlled previously. Check tsh today, continue levothyroxine 170mcg.

## 2014-05-12 ENCOUNTER — Telehealth: Payer: Self-pay | Admitting: Family Medicine

## 2014-05-12 DIAGNOSIS — E039 Hypothyroidism, unspecified: Secondary | ICD-10-CM

## 2014-05-12 LAB — COMPREHENSIVE METABOLIC PANEL
ALT: 27 U/L (ref 0–53)
AST: 17 U/L (ref 0–37)
Albumin: 4.6 g/dL (ref 3.5–5.2)
Alkaline Phosphatase: 57 U/L (ref 39–117)
BUN: 12 mg/dL (ref 6–23)
CALCIUM: 10.2 mg/dL (ref 8.4–10.5)
CHLORIDE: 101 meq/L (ref 96–112)
CO2: 27 mEq/L (ref 19–32)
Creat: 0.88 mg/dL (ref 0.50–1.35)
Glucose, Bld: 90 mg/dL (ref 70–99)
POTASSIUM: 4.3 meq/L (ref 3.5–5.3)
Sodium: 139 mEq/L (ref 135–145)
TOTAL PROTEIN: 7.3 g/dL (ref 6.0–8.3)
Total Bilirubin: 0.4 mg/dL (ref 0.2–1.2)

## 2014-05-12 LAB — LDL CHOLESTEROL, DIRECT: LDL DIRECT: 97 mg/dL

## 2014-05-12 LAB — TSH: TSH: 0.063 u[IU]/mL — AB (ref 0.350–4.500)

## 2014-05-12 MED ORDER — LEVOTHYROXINE SODIUM 137 MCG PO TABS: ORAL_TABLET | ORAL | Status: DC

## 2014-05-12 NOTE — Assessment & Plan Note (Signed)
Levothyroxine 179mcg-->137 mcg due to low TSH Lab Results  Component Value Date   TSH 0.063* 05/11/2014

## 2014-05-12 NOTE — Telephone Encounter (Signed)
See problem list

## 2014-11-08 ENCOUNTER — Encounter: Payer: Self-pay | Admitting: Family Medicine

## 2014-11-09 ENCOUNTER — Other Ambulatory Visit: Payer: Self-pay

## 2014-11-09 DIAGNOSIS — E039 Hypothyroidism, unspecified: Secondary | ICD-10-CM

## 2014-11-09 MED ORDER — LEVOTHYROXINE SODIUM 137 MCG PO TABS: ORAL_TABLET | ORAL | Status: DC

## 2014-11-09 MED ORDER — ATORVASTATIN CALCIUM 20 MG PO TABS: 10.0000 mg | ORAL_TABLET | Freq: Every day | ORAL | Status: DC

## 2014-12-31 ENCOUNTER — Ambulatory Visit (INDEPENDENT_AMBULATORY_CARE_PROVIDER_SITE_OTHER): Payer: PRIVATE HEALTH INSURANCE | Admitting: Family Medicine

## 2014-12-31 ENCOUNTER — Encounter: Payer: Self-pay | Admitting: Family Medicine

## 2014-12-31 VITALS — BP 110/68 | HR 96 | Temp 98.1°F | Wt 159.0 lb

## 2014-12-31 DIAGNOSIS — M940 Chondrocostal junction syndrome [Tietze]: Secondary | ICD-10-CM | POA: Diagnosis not present

## 2014-12-31 DIAGNOSIS — Z720 Tobacco use: Secondary | ICD-10-CM

## 2014-12-31 DIAGNOSIS — R079 Chest pain, unspecified: Secondary | ICD-10-CM | POA: Diagnosis not present

## 2014-12-31 DIAGNOSIS — F17200 Nicotine dependence, unspecified, uncomplicated: Secondary | ICD-10-CM

## 2014-12-31 DIAGNOSIS — R0789 Other chest pain: Secondary | ICD-10-CM | POA: Diagnosis not present

## 2014-12-31 DIAGNOSIS — E039 Hypothyroidism, unspecified: Secondary | ICD-10-CM

## 2014-12-31 LAB — TSH: TSH: 0.251 u[IU]/mL — ABNORMAL LOW (ref 0.350–4.500)

## 2014-12-31 MED ORDER — MELOXICAM 15 MG PO TABS: 15.0000 mg | ORAL_TABLET | Freq: Every day | ORAL | Status: DC

## 2014-12-31 NOTE — Patient Instructions (Addendum)
I have a very low suspicion that this is related to your heart but I do not like your family history in regards to this. For this reason I want close follow up if you do not improve over the next month.  Plan -stop leaning on that left side for 30 minutes before bed -Take meloxicam for 10 days -ice area 3x a day for 30 minutes then switch to heat at least once a day  Ok to continue manipulation for now, may have to reconsider in future  Check blood for thyroid on way out  Costochondritis Costochondritis, sometimes called Tietze syndrome, is a swelling and irritation (inflammation) of the tissue (cartilage) that connects your ribs with your breastbone (sternum). It causes pain in the chest and rib area. Costochondritis usually goes away on its own over time. It can take up to 6 weeks or longer to get better, especially if you are unable to limit your activities. CAUSES  Some cases of costochondritis have no known cause. Possible causes include:  Injury (trauma).  Exercise or activity such as lifting.  Severe coughing. SIGNS AND SYMPTOMS  Pain and tenderness in the chest and rib area.  Pain that gets worse when coughing or taking deep breaths.  Pain that gets worse with specific movements. DIAGNOSIS  Your health care provider will do a physical exam and ask about your symptoms. Chest X-rays or other tests may be done to rule out other problems. TREATMENT  Costochondritis usually goes away on its own over time. Your health care provider may prescribe medicine to help relieve pain. HOME CARE INSTRUCTIONS   Avoid exhausting physical activity. Try not to strain your ribs during normal activity. This would include any activities using chest, abdominal, and side muscles, especially if heavy weights are used.  Apply ice to the affected area for the first 2 days after the pain begins.  Put ice in a plastic bag.  Place a towel between your skin and the bag.  Leave the ice on for 20  minutes, 2-3 times a day.  Only take over-the-counter or prescription medicines as directed by your health care provider. SEEK MEDICAL CARE IF:  You have redness or swelling at the rib joints. These are signs of infection.  Your pain does not go away despite rest or medicine. SEEK IMMEDIATE MEDICAL CARE IF:   Your pain increases or you are very uncomfortable.  You have shortness of breath or difficulty breathing.  You cough up blood.  You have worse chest pains, sweating, or vomiting.  You have a fever or persistent symptoms for more than 2-3 days.  You have a fever and your symptoms suddenly get worse. MAKE SURE YOU:   Understand these instructions.  Will watch your condition.  Will get help right away if you are not doing well or get worse. Document Released: 05/17/2005 Document Revised: 05/28/2013 Document Reviewed: 03/11/2013 [facility] Patient Information 2015 [place], [place]. This information is not intended to replace advice given to you by your health care provider. Make sure you discuss any questions you have with your health care provider.

## 2014-12-31 NOTE — Progress Notes (Signed)
Garret Reddish, MD  Subjective:  Joseph Ford is a 64 y.o. year old very pleasant male patient who presents with:  Atypical chest pain Pain to left of lower sternum about 6 months ago. Thought initially just like a pain that would go away in a week. Pain comes and goes. Lasts a few minutes when it occurs usually Seems like he has to stretch to make it pop and then improves. If cannot get it to pop, lasts longer in the day. Happens 2-3x a day. Usually happens with position changes like getting up in bed, pulling shower curtain back, turning with driving. Never occurs with exertion like going up stairs or walking quickly or walking up a hill.  Symptoms not increasing in frequency. Pain at max 7/10 aching. Not increasing in frequency. Worse with cough and sneeze.   Also has pain in his back between his shoulder blades sometimes with this.   ROS-No shortness of breath, nausea, diaphoresis. No left arm or neck pain.   Past Medical History- Risk factors 1/2 PPD smoking, hyperlipidemia Family history- strong family history with mom with MI 67, father MI 53  Medications- reviewed and updated Current Outpatient Prescriptions  Medication Sig Dispense Refill  . aspirin 81 MG tablet Take 81 mg by mouth daily.    Marland Kitchen atorvastatin (LIPITOR) 20 MG tablet Take 0.5 tablets (10 mg total) by mouth daily. TAKE ONE TABLET BY MOUTH EVERY DAY 90 tablet 3  . levothyroxine (SYNTHROID, LEVOTHROID) 137 MCG tablet TAKE ONE TABLET BY MOUTH ONCE DAILY 90 tablet 3   Objective: BP 110/68 mmHg  Pulse 96  Temp(Src) 98.1 F (36.7 C)  Wt 159 lb (72.122 kg) Gen: NAD, resting comfortably CV: RRR no murmurs rubs or gallops Chest wall tenderness along likely rib # 5 at insertion at sternum, though cannot reproduce back pain Lungs: CTAB no crackles, wheeze, rhonchi Abdomen: soft/nontender/nondistended/normal bowel sounds. No rebound or guarding.  Ext: no edema Skin: warm, dry, no rash over chest    EKG: Rate 94, sinus  rhythm, normal axis, normal intervals, no obvious hypertrophy, no st or t wave changes (compared to EKG 12/06/2007)   Assessment/Plan:  Atypical chest pain suspect costochondritis but risk factors concerning Atypical for cardiac pain (not exertional, not improved by rest, aching pains for a few minutes that resolve with "popping" back into place) regardless though with risk factors of smoking, HLD, family history MI mom 57, dad 73. EKG without ischemic changes. Pain also reproducible on exam, worse with cough or sneeze. I strongly suspect this is costochondritis. His nighttime positioning may promote this and we will stop that as well as use short course meloxicam. We discussed strict follow up and planned follow up in a month if no improvement.    1 month.   Orders Placed This Encounter  Procedures  . TSH  . EKG 12-Lead    Meds ordered this encounter  Medications  . meloxicam (MOBIC) 15 MG tablet    Sig: Take 1 tablet (15 mg total) by mouth daily.    Dispense:  10 tablet    Refill:  0

## 2014-12-31 NOTE — Assessment & Plan Note (Addendum)
Atypical for cardiac pain (not exertional, not improved by rest, aching pains for a few minutes that resolve with "popping" back into place) regardless though with risk factors of smoking, HLD, family history MI mom 38, dad 71. EKG without ischemic changes. Pain also reproducible on exam, worse with cough or sneeze. I strongly suspect this is costochondritis. His nighttime positioning may promote this and we will stop that as well as use short course meloxicam. We discussed strict follow up and planned follow up in a month if no improvement.

## 2015-01-01 ENCOUNTER — Other Ambulatory Visit: Payer: Self-pay | Admitting: Family Medicine

## 2015-01-01 DIAGNOSIS — E039 Hypothyroidism, unspecified: Secondary | ICD-10-CM

## 2015-01-01 MED ORDER — LEVOTHYROXINE SODIUM 125 MCG PO TABS: ORAL_TABLET | ORAL | Status: DC

## 2015-01-04 NOTE — Addendum Note (Signed)
Addended by: Clyde Lundborg A on: 01/04/2015 08:24 AM   Modules accepted: Orders

## 2015-02-18 ENCOUNTER — Other Ambulatory Visit (INDEPENDENT_AMBULATORY_CARE_PROVIDER_SITE_OTHER): Payer: PRIVATE HEALTH INSURANCE

## 2015-02-18 DIAGNOSIS — E039 Hypothyroidism, unspecified: Secondary | ICD-10-CM

## 2015-02-19 LAB — TSH: TSH: 0.923 u[IU]/mL (ref 0.350–4.500)

## 2015-06-04 ENCOUNTER — Other Ambulatory Visit (INDEPENDENT_AMBULATORY_CARE_PROVIDER_SITE_OTHER): Payer: PRIVATE HEALTH INSURANCE

## 2015-06-04 DIAGNOSIS — Z0000 Encounter for general adult medical examination without abnormal findings: Secondary | ICD-10-CM | POA: Diagnosis not present

## 2015-06-04 LAB — CBC WITH DIFFERENTIAL/PLATELET
BASOS PCT: 0 % (ref 0–1)
Basophils Absolute: 0 10*3/uL (ref 0.0–0.1)
Eosinophils Absolute: 0.2 10*3/uL (ref 0.0–0.7)
Eosinophils Relative: 3 % (ref 0–5)
HCT: 39.1 % (ref 39.0–52.0)
HEMOGLOBIN: 13.9 g/dL (ref 13.0–17.0)
Lymphocytes Relative: 32 % (ref 12–46)
Lymphs Abs: 2.2 10*3/uL (ref 0.7–4.0)
MCH: 32 pg (ref 26.0–34.0)
MCHC: 35.5 g/dL (ref 30.0–36.0)
MCV: 90.1 fL (ref 78.0–100.0)
MONO ABS: 0.5 10*3/uL (ref 0.1–1.0)
MPV: 9.4 fL (ref 8.6–12.4)
Monocytes Relative: 7 % (ref 3–12)
NEUTROS ABS: 4.1 10*3/uL (ref 1.7–7.7)
NEUTROS PCT: 58 % (ref 43–77)
PLATELETS: 283 10*3/uL (ref 150–400)
RBC: 4.34 MIL/uL (ref 4.22–5.81)
RDW: 13.6 % (ref 11.5–15.5)
WBC: 7 10*3/uL (ref 4.0–10.5)

## 2015-06-05 LAB — HEPATIC FUNCTION PANEL
ALBUMIN: 4 g/dL (ref 3.6–5.1)
ALK PHOS: 47 U/L (ref 40–115)
ALT: 18 U/L (ref 9–46)
AST: 13 U/L (ref 10–35)
BILIRUBIN INDIRECT: 0.4 mg/dL (ref 0.2–1.2)
BILIRUBIN TOTAL: 0.5 mg/dL (ref 0.2–1.2)
Bilirubin, Direct: 0.1 mg/dL (ref <=0.2)
Total Protein: 6.5 g/dL (ref 6.1–8.1)

## 2015-06-05 LAB — URINALYSIS
Bilirubin Urine: NEGATIVE
GLUCOSE, UA: NEGATIVE
Ketones, ur: NEGATIVE
LEUKOCYTES UA: NEGATIVE
NITRITE: NEGATIVE
PH: 5.5 (ref 5.0–8.0)
Protein, ur: NEGATIVE
SPECIFIC GRAVITY, URINE: 1.016 (ref 1.001–1.035)

## 2015-06-05 LAB — PSA: PSA: 1.23 ng/mL (ref <=4.00)

## 2015-06-05 LAB — BASIC METABOLIC PANEL
BUN: 14 mg/dL (ref 7–25)
CALCIUM: 9.5 mg/dL (ref 8.6–10.3)
CO2: 27 mmol/L (ref 20–31)
Chloride: 104 mmol/L (ref 98–110)
Creat: 1 mg/dL (ref 0.70–1.25)
GLUCOSE: 97 mg/dL (ref 65–99)
Potassium: 4.3 mmol/L (ref 3.5–5.3)
SODIUM: 139 mmol/L (ref 135–146)

## 2015-06-05 LAB — LIPID PANEL
CHOL/HDL RATIO: 4.5 ratio (ref <=5.0)
CHOLESTEROL: 156 mg/dL (ref 125–200)
HDL: 35 mg/dL — ABNORMAL LOW (ref >=40)
LDL Cholesterol: 100 mg/dL (ref <130)
TRIGLYCERIDES: 106 mg/dL (ref <150)
VLDL: 21 mg/dL (ref <30)

## 2015-06-05 LAB — TSH: TSH: 1.677 u[IU]/mL (ref 0.350–4.500)

## 2015-06-11 ENCOUNTER — Encounter: Payer: Self-pay | Admitting: Family Medicine

## 2015-06-11 ENCOUNTER — Ambulatory Visit (INDEPENDENT_AMBULATORY_CARE_PROVIDER_SITE_OTHER): Payer: PRIVATE HEALTH INSURANCE | Admitting: Internal Medicine

## 2015-06-11 VITALS — BP 110/70 | HR 93 | Temp 98.1°F | Resp 16 | Ht 68.0 in | Wt 160.0 lb

## 2015-06-11 DIAGNOSIS — Z23 Encounter for immunization: Secondary | ICD-10-CM

## 2015-06-11 DIAGNOSIS — E039 Hypothyroidism, unspecified: Secondary | ICD-10-CM

## 2015-06-11 DIAGNOSIS — E785 Hyperlipidemia, unspecified: Secondary | ICD-10-CM

## 2015-06-11 DIAGNOSIS — Z0000 Encounter for general adult medical examination without abnormal findings: Secondary | ICD-10-CM

## 2015-06-11 DIAGNOSIS — F17200 Nicotine dependence, unspecified, uncomplicated: Secondary | ICD-10-CM

## 2015-06-11 NOTE — Progress Notes (Signed)
Subjective:    Patient ID: Joseph Ford, male    DOB: 1951/01/18, 64 y.o.   MRN: [id number]  HPI  64 year old patient who is seen today for a preventive health examination.  Medical problems include dyslipidemia, hypothyroidism, and ongoing tobacco use Colonoscopy 2008   Past Medical History  Diagnosis Date  . Hyperlipidemia   . Hypothyroidism     Social History   Social History  . Marital Status: Single    Spouse Name: N/A  . Number of Children: N/A  . Years of Education: N/A   Occupational History  . Not on file.   Social History Main Topics  . Smoking status: Current Every Day Smoker -- 0.50 packs/day    Types: Cigarettes  . Smokeless tobacco: Not on file  . Alcohol Use: 0.6 oz/week    1 Glasses of wine per week  . Drug Use: No  . Sexual Activity: Not on file   Other Topics Concern  . Not on file   Social History Narrative   Divorced. No children.    Lives with sister and husband who live in his basement. Has another sister that lives with him in his house.       Retired from post office.       Hobbies: yardwork, Architect work             Past Surgical History  Procedure Laterality Date  . Left eye surgery  1959    at [facility] eyes    Family History  Problem Relation Age of Onset  . Hypertension Mother   . Heart disease Mother 10    died of heart attack  . Heart attack Father   . Heart disease Father 52    "mild heart attack"  . Diabetes Maternal Aunt   . Diabetes Maternal Grandmother   . Cancer Sister     breast  . Thyroid disease Sister     unremoved, unclear reason    No Known Allergies  Current Outpatient Prescriptions on File Prior to Visit  Medication Sig Dispense Refill  . aspirin 81 MG tablet Take 81 mg by mouth daily.    Marland Kitchen atorvastatin (LIPITOR) 20 MG tablet Take 0.5 tablets (10 mg total) by mouth daily. TAKE ONE TABLET BY MOUTH EVERY DAY 90 tablet 3  . levothyroxine (SYNTHROID, LEVOTHROID) 125 MCG tablet TAKE  ONE TABLET BY MOUTH ONCE DAILY 90 tablet 3  . meloxicam (MOBIC) 15 MG tablet Take 1 tablet (15 mg total) by mouth daily. 10 tablet 0   No current facility-administered medications on file prior to visit.    BP 110/70 mmHg  Pulse 93  Temp(Src) 98.1 F (36.7 C)  Resp 16  Ht 5\' 8"  (1.727 m)  Wt 160 lb (72.576 kg)  BMI 24.33 kg/m2  SpO2 96%     Review of Systems  Constitutional: Negative for fever, chills, activity change, appetite change and fatigue.  HENT: Negative for congestion, dental problem, ear pain, hearing loss, mouth sores, rhinorrhea, sinus pressure, sneezing, tinnitus, trouble swallowing and voice change.   Eyes: Negative for photophobia, pain, redness and visual disturbance.  Respiratory: Negative for apnea, cough, choking, chest tightness, shortness of breath and wheezing.   Cardiovascular: Negative for chest pain, palpitations and leg swelling.  Gastrointestinal: Negative for nausea, vomiting, abdominal pain, diarrhea, constipation, blood in stool, abdominal distention, anal bleeding and rectal pain.  Genitourinary: Negative for dysuria, urgency, frequency, hematuria, flank pain, decreased urine volume, discharge, penile swelling, scrotal swelling, difficulty urinating,  genital sores and testicular pain.  Musculoskeletal: Negative for myalgias, back pain, joint swelling, arthralgias, gait problem, neck pain and neck stiffness.  Skin: Negative for color change, rash and wound.  Neurological: Negative for dizziness, tremors, seizures, syncope, facial asymmetry, speech difficulty, weakness, light-headedness, numbness and headaches.  Hematological: Negative for adenopathy. Does not bruise/bleed easily.  Psychiatric/Behavioral: Negative for suicidal ideas, hallucinations, behavioral problems, confusion, sleep disturbance, self-injury, dysphoric mood, decreased concentration and agitation. The patient is not nervous/anxious.        Objective:   Physical Exam    Constitutional: He appears well-developed and well-nourished.  Blood pressure low normal  HENT:  Head: Normocephalic and atraumatic.  Right Ear: External ear normal.  Left Ear: External ear normal.  Nose: Nose normal.  Mouth/Throat: Oropharynx is clear and moist.  Eyes: Conjunctivae and EOM are normal. Pupils are equal, round, and reactive to light. No scleral icterus.  Neck: Normal range of motion. Neck supple. No JVD present. No thyromegaly present.  Cardiovascular: Regular rhythm, normal heart sounds and intact distal pulses.  Exam reveals no gallop and no friction rub.   No murmur heard. Pulmonary/Chest: Effort normal and breath sounds normal. He exhibits no tenderness.  Abdominal: Soft. Bowel sounds are normal. He exhibits no distension and no mass. There is no tenderness.  Genitourinary: Prostate normal and penis normal. Guaiac negative stool.  Musculoskeletal: Normal range of motion. He exhibits no edema or tenderness.  Lymphadenopathy:    He has no cervical adenopathy.  Neurological: He is alert. He has normal reflexes. No cranial nerve deficit. Coordination normal.  Skin: Skin is warm and dry. No rash noted.  Psychiatric: He has a normal mood and affect. His behavior is normal.          Assessment & Plan:   Preventive health examination Hypothyroidism.  Continue levothyroxine supplementation Dyslipidemia.  Continue statin therapy Ongoing tobacco use.  Total smoking cessation encouraged

## 2015-06-11 NOTE — Patient Instructions (Signed)

## 2015-11-26 ENCOUNTER — Other Ambulatory Visit: Payer: Self-pay | Admitting: *Deleted

## 2015-11-26 MED ORDER — ATORVASTATIN CALCIUM 20 MG PO TABS: 10.0000 mg | ORAL_TABLET | Freq: Every day | ORAL | Status: DC

## 2015-12-23 ENCOUNTER — Other Ambulatory Visit: Payer: Self-pay | Admitting: Family Medicine

## 2016-01-25 ENCOUNTER — Other Ambulatory Visit: Payer: Self-pay | Admitting: Family Medicine

## 2016-04-20 ENCOUNTER — Ambulatory Visit (INDEPENDENT_AMBULATORY_CARE_PROVIDER_SITE_OTHER): Payer: No Typology Code available for payment source | Admitting: Podiatry

## 2016-04-20 VITALS — BP 124/86 | HR 105 | Wt 160.0 lb

## 2016-04-20 DIAGNOSIS — B351 Tinea unguium: Secondary | ICD-10-CM | POA: Diagnosis not present

## 2016-04-20 DIAGNOSIS — L600 Ingrowing nail: Secondary | ICD-10-CM | POA: Diagnosis not present

## 2016-04-20 NOTE — Progress Notes (Signed)
   Subjective:    Patient ID: Joseph Ford, male    DOB: [birth year], 65 y.o.   MRN: [id number]  HPI  I think I have an ingrown toe nail on my right 1st toe.  The nail seems to be thickening and has been touchy for 6-8 weeks.     Review of Systems  HENT: Positive for tinnitus.        Objective:   Physical Exam        Assessment & Plan:

## 2016-04-20 NOTE — Patient Instructions (Signed)

## 2016-04-20 NOTE — Progress Notes (Signed)
Subjective:     Patient ID: Joseph Ford, male   DOB: 1951/03/25, 65 y.o.   MRN: [id number]  HPI patient presents stating he's had a painful ingrown toenail of his left big toe and it's hard for him to wear shoe gear comfortably. Tried to trim it and soak it without relief   Review of Systems  All other systems reviewed and are negative.      Objective:   Physical Exam  Constitutional: He is oriented to person, place, and time.  Cardiovascular: Intact distal pulses.   Musculoskeletal: Normal range of motion.  Neurological: He is oriented to person, place, and time.  Skin: Skin is warm.  Nursing note and vitals reviewed.  neurovascular status intact muscle strength adequate range of motion within normal limits with patient noted to have inflammatory changes the left hallux medial border with incurvation of the nailbed and pain when palpated. Patient has good digital perfusion and is well oriented 3 and has some yellow discoloration of his nailbeds     Assessment:     Ingrown toenail deformity left hallux medial border that's painful when pressed    Plan:     H&P condition reviewed and recommended correction. Explained the procedure and risk and today I infiltrated the left hallux 60 [place] Xylocaine Marcaine mixture remove the corner exposed matrix and applied phenol 3 applications 30 seconds followed by alcohol lavage and sterile dressing. Gave instructions on soaks and reappoint

## 2016-05-15 ENCOUNTER — Telehealth: Payer: Self-pay | Admitting: *Deleted

## 2016-05-15 NOTE — Telephone Encounter (Signed)
Called patient at [phone] (Home #) to check to see how they were doing from their ingrown toenail procedure that was performed on Thursday, April 20, 2016. Pt's sister stated, "He's doing pretty good and not in any pain".

## 2016-06-06 ENCOUNTER — Other Ambulatory Visit (INDEPENDENT_AMBULATORY_CARE_PROVIDER_SITE_OTHER): Payer: No Typology Code available for payment source

## 2016-06-06 DIAGNOSIS — R319 Hematuria, unspecified: Secondary | ICD-10-CM

## 2016-06-06 DIAGNOSIS — Z0000 Encounter for general adult medical examination without abnormal findings: Secondary | ICD-10-CM | POA: Diagnosis not present

## 2016-06-06 LAB — POC URINALSYSI DIPSTICK (AUTOMATED)
BILIRUBIN UA: NEGATIVE
Glucose, UA: NEGATIVE
KETONES UA: NEGATIVE
LEUKOCYTES UA: NEGATIVE
Nitrite, UA: NEGATIVE
PH UA: 5.5
Protein, UA: NEGATIVE
Spec Grav, UA: 1.02
Urobilinogen, UA: 0.2

## 2016-06-06 LAB — CBC WITH DIFFERENTIAL/PLATELET
BASOS PCT: 0 %
Basophils Absolute: 0 cells/uL (ref 0–200)
EOS PCT: 4 %
Eosinophils Absolute: 236 cells/uL (ref 15–500)
HEMATOCRIT: 40.1 % (ref 38.5–50.0)
Hemoglobin: 13.8 g/dL (ref 13.2–17.1)
LYMPHS PCT: 40 %
Lymphs Abs: 2360 cells/uL (ref 850–3900)
MCH: 31.5 pg (ref 27.0–33.0)
MCHC: 34.4 g/dL (ref 32.0–36.0)
MCV: 91.6 fL (ref 80.0–100.0)
MONO ABS: 472 {cells}/uL (ref 200–950)
MONOS PCT: 8 %
MPV: 9.3 fL (ref 7.5–12.5)
NEUTROS PCT: 48 %
Neutro Abs: 2832 cells/uL (ref 1500–7800)
PLATELETS: 279 10*3/uL (ref 140–400)
RBC: 4.38 MIL/uL (ref 4.20–5.80)
RDW: 13.6 % (ref 11.0–15.0)
WBC: 5.9 10*3/uL (ref 3.8–10.8)

## 2016-06-06 LAB — URINALYSIS, MICROSCOPIC ONLY: RBC / HPF: NONE SEEN (ref 0–?)

## 2016-06-06 LAB — TSH: TSH: 2.51 mIU/L (ref 0.40–4.50)

## 2016-06-07 LAB — HEPATIC FUNCTION PANEL
ALT: 17 U/L (ref 9–46)
AST: 13 U/L (ref 10–35)
Albumin: 4.1 g/dL (ref 3.6–5.1)
Alkaline Phosphatase: 44 U/L (ref 40–115)
BILIRUBIN DIRECT: 0.1 mg/dL (ref <=0.2)
BILIRUBIN INDIRECT: 0.3 mg/dL (ref 0.2–1.2)
BILIRUBIN TOTAL: 0.4 mg/dL (ref 0.2–1.2)
Total Protein: 6.7 g/dL (ref 6.1–8.1)

## 2016-06-07 LAB — LIPID PANEL
CHOL/HDL RATIO: 3.7 ratio (ref <=5.0)
CHOLESTEROL: 140 mg/dL (ref 125–200)
HDL: 38 mg/dL — AB (ref >=40)
LDL CALC: 81 mg/dL (ref <130)
TRIGLYCERIDES: 104 mg/dL (ref <150)
VLDL: 21 mg/dL (ref <30)

## 2016-06-07 LAB — BASIC METABOLIC PANEL
BUN: 16 mg/dL (ref 7–25)
CHLORIDE: 104 mmol/L (ref 98–110)
CO2: 24 mmol/L (ref 20–31)
CREATININE: 0.94 mg/dL (ref 0.70–1.25)
Calcium: 9.2 mg/dL (ref 8.6–10.3)
GLUCOSE: 96 mg/dL (ref 65–99)
Potassium: 4.2 mmol/L (ref 3.5–5.3)
Sodium: 139 mmol/L (ref 135–146)

## 2016-06-07 LAB — PSA: PSA: 1 ng/mL (ref <=4.0)

## 2016-06-16 ENCOUNTER — Ambulatory Visit (INDEPENDENT_AMBULATORY_CARE_PROVIDER_SITE_OTHER): Payer: No Typology Code available for payment source | Admitting: Family Medicine

## 2016-06-16 ENCOUNTER — Encounter: Payer: Self-pay | Admitting: Family Medicine

## 2016-06-16 VITALS — BP 132/80 | HR 88 | Temp 97.8°F | Ht 68.25 in | Wt 164.4 lb

## 2016-06-16 DIAGNOSIS — F17200 Nicotine dependence, unspecified, uncomplicated: Secondary | ICD-10-CM | POA: Diagnosis not present

## 2016-06-16 DIAGNOSIS — Z1283 Encounter for screening for malignant neoplasm of skin: Secondary | ICD-10-CM | POA: Diagnosis not present

## 2016-06-16 DIAGNOSIS — E785 Hyperlipidemia, unspecified: Secondary | ICD-10-CM

## 2016-06-16 DIAGNOSIS — Z0000 Encounter for general adult medical examination without abnormal findings: Secondary | ICD-10-CM

## 2016-06-16 DIAGNOSIS — R0789 Other chest pain: Secondary | ICD-10-CM | POA: Diagnosis not present

## 2016-06-16 DIAGNOSIS — Z23 Encounter for immunization: Secondary | ICD-10-CM | POA: Diagnosis not present

## 2016-06-16 MED ORDER — LEVOTHYROXINE SODIUM 125 MCG PO TABS: 125.0000 ug | ORAL_TABLET | Freq: Every day | ORAL | 3 refills | Status: DC

## 2016-06-16 MED ORDER — ATORVASTATIN CALCIUM 20 MG PO TABS: 10.0000 mg | ORAL_TABLET | Freq: Every day | ORAL | 1 refills | Status: DC

## 2016-06-16 NOTE — Addendum Note (Signed)
Addended by: Lucianne Lei M on: 06/16/2016 10:29 AM   Modules accepted: Orders

## 2016-06-16 NOTE — Progress Notes (Signed)
Pre visit review using our clinic review tool, if applicable. No additional management support is needed unless otherwise documented below in the visit note. 

## 2016-06-16 NOTE — Progress Notes (Signed)
Phone: [phone]  Subjective:  Patient presents today for their annual physical. Chief complaint-noted.   See problem oriented charting- ROS- full  review of systems was completed and negative except for occasional chest pain relieved when gets up and hears "pop", denies SOB, diaphoresis, nausea, left arm or neck pain  The following were reviewed and entered/updated in epic: Past Medical History:  Diagnosis Date  . Hyperlipidemia   . Hypothyroidism    Patient Active Problem List   Diagnosis Date Noted  . Atypical chest pain suspect costochondritis but risk factors concerning 12/31/2014    Priority: High  . TOBACCO USE 09/17/2006    Priority: High  . Hypothyroidism 12/31/2014    Priority: Medium  . Dyslipidemia 09/17/2006    Priority: Medium   Past Surgical History:  Procedure Laterality Date  . left eye surgery  1959   at [facility]    Family History  Problem Relation Age of Onset  . Hypertension Mother   . Heart disease Mother 55    died of heart attack  . Heart attack Father   . Heart disease Father 55    "mild heart attack"  . Diabetes Maternal Aunt   . Diabetes Maternal Grandmother   . Cancer Sister     breast  . Thyroid disease Sister     unremoved, unclear reason    Medications- reviewed and updated Current Outpatient Prescriptions  Medication Sig Dispense Refill  . atorvastatin (LIPITOR) 20 MG tablet Take 0.5 tablets (10 mg total) by mouth daily. 90 tablet 1  . ibuprofen (ADVIL,MOTRIN) 200 MG tablet Take 200 mg by mouth 2 (two) times daily.    Marland Kitchen levothyroxine (SYNTHROID, LEVOTHROID) 125 MCG tablet TAKE ONE TABLET BY MOUTH ONCE DAILY 30 tablet 6   No current facility-administered medications for this visit.     Allergies-reviewed and updated No Known Allergies  Social History   Social History  . Marital status: Single    Spouse name: N/A  . Number of children: N/A  . Years of education: N/A   Social History Main Topics  . Smoking  status: Current Every Day Smoker    Packs/day: 0.50    Types: Cigarettes  . Smokeless tobacco: None  . Alcohol use 0.6 oz/week    1 Glasses of wine per week  . Drug use: No  . Sexual activity: Not Asked   Other Topics Concern  . None   Social History Narrative   Divorced. No children.    Lives with sister and husband who live in his basement. Has another sister that lives with him in his house.       Retired from post office.       Hobbies: yardwork, Architect work             Objective: BP 132/80 (BP Location: Left Arm, Patient Position: Sitting, Cuff Size: Normal)   Pulse 88   Temp 97.8 F (36.6 C) (Oral)   Ht 5' 8.25" (1.734 m)   Wt 164 lb 6.4 oz (74.6 kg)   SpO2 95%   BMI 24.81 kg/m  Gen: NAD, resting comfortably HEENT: Mucous membranes are moist. Oropharynx normal Neck: no thyromegaly CV: RRR no murmurs rubs or gallops Lungs: CTAB no crackles, wheeze, rhonchi Abdomen: soft/nontender/nondistended/normal bowel sounds. No rebound or guarding.  Ext: no edema Skin: warm, dry Neuro: grossly normal, moves all extremities, PERRLA Rectal: normal tone, upper end of normal sized prostate, no masses or tenderness   Assessment/Plan:  65 y.o. male presenting  for annual physical.  Health Maintenance counseling: 1. Anticipatory guidance: Patient counseled regarding regular dental exams, eye exams, wearing seatbelts.  2. Risk factor reduction:  Advised patient of need for regular exercise and diet rich and fruits and vegetables to reduce risk of heart attack and stroke. No regular routine- pretty active- though does use push mower/weed eat etc. Advised 150 minutes exercise per week.  3. Immunizations/screenings/ancillary studies Immunization History  Administered Date(s) Administered  . Influenza,inj,Quad PF,36+ Mos 05/11/2014  . Influenza,inj,quad, With Preservative 06/11/2015  . Pneumococcal Polysaccharide-23 04/04/2013  . Td 09/17/2006  . Zoster 04/04/2013    Health Maintenance Due  Topic Date Due  . Hepatitis C Screening - next labs 10/04/52  . HIV Screening - next labs 03/21/1968  . INFLUENZA VACCINE - today 03/21/2016   4. Prostate cancer screening- low risk based off PSA trend and rectal exam  Lab Results  Component Value Date   PSA 1.0 06/06/2016   PSA 1.23 06/04/2015   PSA 0.9 12/20/2010   5. Colon cancer screening - 10/02/2006 with 10 year follow up. Hyperplastic polyps.  6. Skin cancer screening- refer today under screening for skin cancer  Status of chronic or acute concerns  HLD- atorvastatin 20mg  1/2 tablet, well controlled. Advised push exercise to increase HDL Lab Results  Component Value Date   CHOL 140 06/06/2016   HDL 38 (L) 06/06/2016   LDLCALC 81 06/06/2016   LDLDIRECT 97 05/11/2014   TRIG 104 06/06/2016   CHOLHDL 3.7 06/06/2016   Hypothyroidism- controlled on levothyroxine 125 mcg Lab Results  Component Value Date   TSH 2.51 06/06/2016   Smoking- 1/2 PPD, 50 years- 2 packs a day in past. At least 30 pack years. AAA screen at 30. Agrees to lung cancer screening program. Advised to quit- not ready  Atypical chest pain suspect costochondritis but risk factors concerning Patient still with some intermittent chest pain- a lot of times even just with sitting- when he gets up from seated position- hears a "pop" and pain goes away. Pushing push mower- no issues and denies issues with exertion- likely not cardiac  1 year CPE unless concerns about thyroid  Orders Placed This Encounter  Procedures  . Ambulatory referral to Dermatology    Referral Priority:   Routine    Referral Type:   Consultation    Referral Reason:   Specialty Services Required    Requested Specialty:   Dermatology    Number of Visits Requested:   1  . Ambulatory Referral for Lung Cancer Scre    Referral Priority:   Routine    Referral Type:   Consultation    Referral Reason:   Specialty Services Required    Number of Visits Requested:   1     Meds ordered this encounter  Medications  . atorvastatin (LIPITOR) 20 MG tablet    Sig: Take 0.5 tablets (10 mg total) by mouth daily.    Dispense:  100 tablet    Refill:  1  . levothyroxine (SYNTHROID, LEVOTHROID) 125 MCG tablet    Sig: Take 1 tablet (125 mcg total) by mouth daily.    Dispense:  100 tablet    Refill:  3    Return precautions advised.   Garret Reddish, MD

## 2016-06-16 NOTE — Patient Instructions (Addendum)
We will call you within a week about your referral to dermatology and lung cancer screening program. If you do not hear within 2 weeks, give [place] a call.   Consider hepatitis C and HIV screen next labs  Thanks for getting your flu shot  Work on getting exercise up to 150 minutes a week

## 2016-06-16 NOTE — Assessment & Plan Note (Signed)
Patient still with some intermittent chest pain- a lot of times even just with sitting- when he gets up from seated position- hears a "pop" and pain goes away. Pushing push mower- no issues and denies issues with exertion- likely not cardiac

## 2016-07-07 ENCOUNTER — Encounter: Payer: Self-pay | Admitting: Family Medicine

## 2016-07-11 ENCOUNTER — Other Ambulatory Visit: Payer: Self-pay

## 2016-07-11 DIAGNOSIS — F17200 Nicotine dependence, unspecified, uncomplicated: Secondary | ICD-10-CM

## 2016-07-11 DIAGNOSIS — Z1283 Encounter for screening for malignant neoplasm of skin: Secondary | ICD-10-CM

## 2016-09-01 ENCOUNTER — Other Ambulatory Visit: Payer: Self-pay | Admitting: Acute Care

## 2016-09-01 DIAGNOSIS — F17210 Nicotine dependence, cigarettes, uncomplicated: Secondary | ICD-10-CM

## 2016-09-08 ENCOUNTER — Encounter: Payer: Self-pay | Admitting: Acute Care

## 2016-09-08 ENCOUNTER — Ambulatory Visit (INDEPENDENT_AMBULATORY_CARE_PROVIDER_SITE_OTHER): Payer: No Typology Code available for payment source | Admitting: Acute Care

## 2016-09-08 ENCOUNTER — Ambulatory Visit (INDEPENDENT_AMBULATORY_CARE_PROVIDER_SITE_OTHER)
Admission: RE | Admit: 2016-09-08 | Discharge: 2016-09-08 | Disposition: A | Discharge status: Home / Self Care | Payer: No Typology Code available for payment source | Source: Ambulatory Visit | Attending: Acute Care | Admitting: Acute Care

## 2016-09-08 DIAGNOSIS — F17210 Nicotine dependence, cigarettes, uncomplicated: Secondary | ICD-10-CM | POA: Diagnosis not present

## 2016-09-08 DIAGNOSIS — R918 Other nonspecific abnormal finding of lung field: Secondary | ICD-10-CM

## 2016-09-08 NOTE — Progress Notes (Signed)
Shared Decision Making Visit Lung Cancer Screening Program [phone])   Eligibility:  Age 66 y.o.  Pack Years Smoking History Calculation 42.5 pack years (# packs/per year x # years smoked)  Recent History of coughing up blood  no  Unexplained weight loss? no ( >Than 15 pounds within the last 6 months )  Prior History Lung / other cancer no (Diagnosis within the last 5 years already requiring surveillance chest CT Scans).  Smoking Status Current Smoker  Former Smokers: Years since quit:NA  Quit Date: NA  Visit Components:  Discussion included one or more decision making aids. yes  Discussion included risk/benefits of screening. yes  Discussion included potential follow up diagnostic testing for abnormal scans. yes  Discussion included meaning and risk of over diagnosis. yes  Discussion included meaning and risk of False Positives. yes  Discussion included meaning of total radiation exposure. yes  Counseling Included:  Importance of adherence to annual lung cancer LDCT screening. yes  Impact of comorbidities on ability to participate in the program. yes  Ability and willingness to under diagnostic treatment. yes  Smoking Cessation Counseling:  Current Smokers:   Discussed importance of smoking cessation. yes  Information about tobacco cessation classes and interventions provided to patient. yes  Patient provided with "ticket" for LDCT Scan. yes  Symptomatic Patient. no   Diagnosis Code: Tobacco Use Z72.0  Asymptomatic Patient yes  Counseling (Intermediate counseling: > three minutes counseling) [id number]  Former Smokers:   Discussed the importance of maintaining cigarette abstinence. yes  Diagnosis Code: Personal History of Nicotine Dependence. [id number]  Information about tobacco cessation classes and interventions provided to patient. Yes  Patient provided with "ticket" for LDCT Scan. yes  Written Order for Lung Cancer Screening with LDCT placed in  Epic. Yes (CT Chest Lung Cancer Screening Low Dose W/O CM) [id number] Z12.2-Screening of respiratory organs Z87.891-Personal history of nicotine dependence  I have spent 20 minutes of face to face time with Joseph Ford discussing the risks and benefits of lung cancer screening. We viewed a power point together that explained in detail the above noted topics. We paused at intervals to allow for questions to be asked and answered to ensure understanding.We discussed that the single most powerful action that he can take to decrease his risk of developing lung cancer is to quit smoking. We discussed whether or not he is ready to commit to setting a quit date.He is not ready to commit to a quit date, but is contemplating ways to quit, cut down smoking. We discussed options for tools to aid in quitting smoking including nicotine replacement therapy, non-nicotine medications, support groups, Quit Smart classes, and behavior modification. We discussed that often times setting smaller, more achievable goals, such as eliminating 1 cigarette a day for a week and then 2 cigarettes a day for a week can be helpful in slowly decreasing the number of cigarettes smoked. This allows for a sense of accomplishment as well as providing a clinical benefit. I gave him  the " Be Stronger Than Your Excuses" card with contact information for community resources, classes, free nicotine replacement therapy, and access to mobile apps, text messaging, and on-line smoking cessation help. I have also given him my card and contact information in the event he needs to contact me. We discussed the time and location of the scan, and that either Joseph Ford, CMA, or I will call with the results within 24-48 hours of receiving them. I have provided him with a copy of the  power point we viewed  as a resource in the event they need reinforcement of the concepts we discussed today in the office. The patient verbalized understanding of all of  the above  and had no further questions upon leaving the office. They have my contact information in the event they have any further questions.  We discussed the high incidence of CAD noted on these exams. The patient is being treated with Lipitor per his PCP. He is aware this may be noted on this exam. I explained that I will share the results of the scan with his PCP for completeness of his medical record. He verbalized understanding.   Magdalen Spatz, NP 09/08/2016

## 2016-09-11 ENCOUNTER — Telehealth: Payer: Self-pay

## 2016-09-11 NOTE — Telephone Encounter (Signed)
I will call the patient. Thanks so much.

## 2016-09-11 NOTE — Telephone Encounter (Signed)
Received call report from [place] with CT. Below I have copied the impression. Eric Form please advise. Thanks.   IMPRESSION: 1. Favor asymmetric subpleural scarring in the right upper lobe. However, by size criteria, lesion is categorized as Lung-RADS Category 4B, suspicious. Repeat low-dose CT chest without contrast is recommended in 3 months. These results will be called to the ordering clinician or representative by the Radiologist Assistant, and communication documented in the PACS or zVision Dashboard. 2. Aortic atherosclerosis (ICD10-170.0). Coronary artery calcification.

## 2016-09-13 ENCOUNTER — Telehealth: Payer: Self-pay | Admitting: Acute Care

## 2016-09-13 DIAGNOSIS — F17210 Nicotine dependence, cigarettes, uncomplicated: Secondary | ICD-10-CM

## 2016-09-13 NOTE — Telephone Encounter (Signed)
I have called Joseph Ford with the results of his low dose CT screening scan. I explained that his scan was read as a Lung RADS 4 B indicating suspicious findings for which additional diagnostic testing and or tissue sampling is recommended.I explained that the radiologist feels the scan represents asymmetric sub pleural scarringing the right upper lobe, and suggested a repeat scan in 3 months. I explained that I also had Dr. Lamonte Sakai assess the scan, and he feels it is fine to wait 3 months to re-evaluate. I told Mr. Guedes that we will schedule and order the scan for 11/2016.He verbalized understanding. We also discussed the incidental finding of Aortic atherosclerosis and Coronary Artery Calcification. We had discussed in the office that this was a common finding. He is currently being treated by his PCP, Dr. Yong Channel, with Lipitor. I will send a copy of the scan to Dr. Yong Channel to ensure completeness of his medical record.Mr. Youker verbalized understanding of the above.

## 2016-10-02 ENCOUNTER — Telehealth: Payer: Self-pay | Admitting: Acute Care

## 2016-10-02 ENCOUNTER — Encounter: Payer: Self-pay | Admitting: Internal Medicine

## 2016-10-02 NOTE — Telephone Encounter (Signed)
Please call Mr. Joseph Ford. He has some questions about a charge for a specific code. He states that his low-dose CT was already paid for by his insurance company but is insisting this has something to do with [place]. I think it has been possibly built to the wrong patient, because it is through a physician that this patient does not go to. Could you possibly follow-up. Thank you so much

## 2016-10-02 NOTE — Telephone Encounter (Signed)
Will route to Eric Form to f/u on. Thanks.

## 2016-10-03 NOTE — Telephone Encounter (Signed)
The wrong code was billed for the CT scan, I sent a charge correction requesting the CPT be changed to [id number] and a corrected claim filed.  Called the patient back to update him on the status of this.  Will watch for the corrected claim to go through.

## 2016-10-09 ENCOUNTER — Ambulatory Visit (INDEPENDENT_AMBULATORY_CARE_PROVIDER_SITE_OTHER): Payer: No Typology Code available for payment source | Admitting: Podiatry

## 2016-10-09 DIAGNOSIS — L03039 Cellulitis of unspecified toe: Secondary | ICD-10-CM | POA: Diagnosis not present

## 2016-10-09 DIAGNOSIS — L600 Ingrowing nail: Secondary | ICD-10-CM | POA: Diagnosis not present

## 2016-10-09 DIAGNOSIS — L03031 Cellulitis of right toe: Secondary | ICD-10-CM

## 2016-10-09 DIAGNOSIS — M79676 Pain in unspecified toe(s): Secondary | ICD-10-CM

## 2016-10-09 DIAGNOSIS — L02611 Cutaneous abscess of right foot: Secondary | ICD-10-CM

## 2016-10-09 MED ORDER — DOXYCYCLINE HYCLATE 100 MG PO TABS: 100.0000 mg | ORAL_TABLET | Freq: Two times a day (BID) | ORAL | 0 refills | Status: DC

## 2016-10-09 MED ORDER — GENTAMICIN SULFATE 0.1 % EX CREA: 1.0000 "application " | TOPICAL_CREAM | Freq: Three times a day (TID) | CUTANEOUS | 0 refills | Status: DC

## 2016-10-09 NOTE — Patient Instructions (Signed)

## 2016-10-13 LAB — WOUND CULTURE
GRAM STAIN: NONE SEEN
Gram Stain: NONE SEEN
Organism ID, Bacteria: NORMAL

## 2016-10-17 NOTE — Progress Notes (Signed)
   Subjective: Patient presents today for evaluation of pain in toe(s). Patient is concerned for possible ingrown nail. Patient states that the pain has been present for a few weeks now. Patient presents today for further treatment and evaluation.  Objective:  General: Well developed, nourished, in no acute distress, alert and oriented x3   Dermatology: Skin is warm, dry and supple bilateral. Right great toe lateral border appears to be erythematous with evidence of an ingrowing nail. Purulent drainage noted with intruding nail into the respective nail fold. Pain on palpation noted to the border of the nail fold. The remaining nails appear unremarkable at this time. There are no open sores, lesions.  Vascular: Dorsalis Pedis artery and Posterior Tibial artery pedal pulses palpable. No lower extremity edema noted.   Neruologic: Grossly intact via light touch bilateral.  Musculoskeletal: Muscular strength within normal limits in all groups bilateral. Normal range of motion noted to all pedal and ankle joints.   Assesement: #1 Paronychia with ingrowing nail right great toe lateral border #2 Pain in toe #3 Incurvated nail  Plan of Care:  1. Patient evaluated.  2. Discussed treatment alternatives and plan of care. Explained nail avulsion procedure and post procedure course to patient. 3. Patient opted for temporary partial nail avulsion.  4. Prior to procedure, local anesthesia infiltration utilized using 3 ml of a 50:50 mixture of 2% plain lidocaine and 0.5% plain marcaine in a normal hallux block fashion and a betadine prep performed.  5. Partial temporary nail avulsion was performed  6. Light dressing applied. 7. Today cultures were taken 8. Prescription for doxycycline 100 mg #20 9. Return to clinic in 2 weeks   Edrick Kins, DPM [facility]  Dr. Edrick Kins, [address]                                        [place], [place] [address]                  Office [phone]  Fax [phone]

## 2016-10-18 ENCOUNTER — Encounter: Payer: Self-pay | Admitting: Family Medicine

## 2016-10-18 DIAGNOSIS — Z85828 Personal history of other malignant neoplasm of skin: Secondary | ICD-10-CM | POA: Insufficient documentation

## 2016-10-23 ENCOUNTER — Ambulatory Visit (INDEPENDENT_AMBULATORY_CARE_PROVIDER_SITE_OTHER): Payer: No Typology Code available for payment source | Admitting: Podiatry

## 2016-10-23 DIAGNOSIS — M79676 Pain in unspecified toe(s): Secondary | ICD-10-CM

## 2016-10-23 DIAGNOSIS — S91109D Unspecified open wound of unspecified toe(s) without damage to nail, subsequent encounter: Secondary | ICD-10-CM | POA: Diagnosis not present

## 2016-10-23 DIAGNOSIS — S91209D Unspecified open wound of unspecified toe(s) with damage to nail, subsequent encounter: Secondary | ICD-10-CM

## 2016-10-23 NOTE — Progress Notes (Signed)
   Subjective: Patient presents today 2 weeks post ingrown nail permanent nail avulsion procedure. Patient states that the toe and nail fold is feeling much better.  Objective: Skin is warm, dry and supple. Nail and respective nail fold appears to be healing appropriately. Open wound to the associated nail fold with a granular wound base and moderate amount of fibrotic tissue. Minimal drainage noted. Mild erythema around the periungual region likely due to phenol chemical matricectomy.  Assessment: #1 postop permanent partial nail avulsion right great toe lateral border #2 open wound periungual nail fold of respective digit.   Plan of care: #1 patient was evaluated  #2 debridement of open wound was performed to the periungual border of the respective toe using a currette. Antibiotic ointment and Band-Aid was applied. #3 patient is to return to clinic on a PRN  basis.   Edrick Kins, DPM [facility]  Dr. Edrick Kins, [address]                                        [place], [place] [address]                Office [phone]  Fax [phone]

## 2016-12-07 ENCOUNTER — Ambulatory Visit (INDEPENDENT_AMBULATORY_CARE_PROVIDER_SITE_OTHER)
Admission: RE | Admit: 2016-12-07 | Discharge: 2016-12-07 | Disposition: A | Discharge status: Home / Self Care | Payer: No Typology Code available for payment source | Source: Ambulatory Visit | Attending: Acute Care | Admitting: Acute Care

## 2016-12-07 DIAGNOSIS — F17210 Nicotine dependence, cigarettes, uncomplicated: Secondary | ICD-10-CM

## 2016-12-07 DIAGNOSIS — R918 Other nonspecific abnormal finding of lung field: Secondary | ICD-10-CM | POA: Diagnosis not present

## 2016-12-18 ENCOUNTER — Other Ambulatory Visit: Payer: Self-pay | Admitting: Acute Care

## 2016-12-18 DIAGNOSIS — F17210 Nicotine dependence, cigarettes, uncomplicated: Secondary | ICD-10-CM

## 2017-06-23 ENCOUNTER — Other Ambulatory Visit: Payer: Self-pay | Admitting: Family Medicine

## 2017-07-11 ENCOUNTER — Encounter: Payer: Self-pay | Admitting: Family Medicine

## 2017-07-11 ENCOUNTER — Ambulatory Visit (INDEPENDENT_AMBULATORY_CARE_PROVIDER_SITE_OTHER): Payer: No Typology Code available for payment source | Admitting: Family Medicine

## 2017-07-11 VITALS — BP 114/68 | HR 89 | Temp 98.4°F | Ht 68.25 in | Wt 161.0 lb

## 2017-07-11 DIAGNOSIS — I700 Atherosclerosis of aorta: Secondary | ICD-10-CM

## 2017-07-11 DIAGNOSIS — Z1159 Encounter for screening for other viral diseases: Secondary | ICD-10-CM | POA: Diagnosis not present

## 2017-07-11 DIAGNOSIS — Z23 Encounter for immunization: Secondary | ICD-10-CM | POA: Diagnosis not present

## 2017-07-11 DIAGNOSIS — E785 Hyperlipidemia, unspecified: Secondary | ICD-10-CM | POA: Diagnosis not present

## 2017-07-11 DIAGNOSIS — E039 Hypothyroidism, unspecified: Secondary | ICD-10-CM | POA: Diagnosis not present

## 2017-07-11 DIAGNOSIS — R319 Hematuria, unspecified: Secondary | ICD-10-CM | POA: Diagnosis not present

## 2017-07-11 DIAGNOSIS — Z0000 Encounter for general adult medical examination without abnormal findings: Secondary | ICD-10-CM

## 2017-07-11 DIAGNOSIS — Z125 Encounter for screening for malignant neoplasm of prostate: Secondary | ICD-10-CM

## 2017-07-11 DIAGNOSIS — Z114 Encounter for screening for human immunodeficiency virus [HIV]: Secondary | ICD-10-CM | POA: Diagnosis not present

## 2017-07-11 DIAGNOSIS — F17200 Nicotine dependence, unspecified, uncomplicated: Secondary | ICD-10-CM | POA: Diagnosis not present

## 2017-07-11 LAB — POC URINALSYSI DIPSTICK (AUTOMATED)
Bilirubin, UA: NEGATIVE
Glucose, UA: NEGATIVE
KETONES UA: NEGATIVE
Leukocytes, UA: NEGATIVE
Nitrite, UA: NEGATIVE
PH UA: 5.5 (ref 5.0–8.0)
PROTEIN UA: NEGATIVE
UROBILINOGEN UA: 0.2 U/dL

## 2017-07-11 NOTE — Addendum Note (Signed)
Addended by: Kayren Eaves T on: 07/11/2017 04:23 PM   Modules accepted: Orders

## 2017-07-11 NOTE — Patient Instructions (Addendum)
Flu shot today. Tdap today.   Cologuard- Joseph Ford will get you set up for this. Good for 3 years. Please call your insurance just to verify covered but usually is covered.

## 2017-07-11 NOTE — Assessment & Plan Note (Signed)
Aortic atherosclerosis- noted on CT with lung cancer screening. Discussed modifiying risk factors like lipids and smoking

## 2017-07-11 NOTE — Addendum Note (Signed)
Addended by: Kayren Eaves T on: 07/11/2017 09:49 AM   Modules accepted: Orders

## 2017-07-11 NOTE — Progress Notes (Signed)
Phone: [phone]  Subjective:  Patient presents today for their annual physical. Chief complaint-noted.   See problem oriented charting- ROS- full  review of systems was completed and negative except for: tinnitus left ear (advised getting hearing checked). Occasional chest pain that resolves when chest "clicks"  The following were reviewed and entered/updated in epic: Past Medical History:  Diagnosis Date  . Hyperlipidemia   . Hypothyroidism    Patient Active Problem List   Diagnosis Date Noted  . Atypical chest pain suspect costochondritis but risk factors concerning 12/31/2014    Priority: High  . TOBACCO USE 09/17/2006    Priority: High  . Hypothyroidism 12/31/2014    Priority: Medium  . Dyslipidemia 09/17/2006    Priority: Medium  . Aortic atherosclerosis ([place]) 07/11/2017  . History of squamous cell carcinoma of skin 10/18/2016   Past Surgical History:  Procedure Laterality Date  . left eye surgery  1959   at [facility]    Family History  Problem Relation Age of Onset  . Hypertension Mother   . Heart disease Mother 23       died of heart attack  . Heart attack Father   . Heart disease Father 77       "mild heart attack"  . Diabetes Maternal Aunt   . Diabetes Maternal Grandmother   . Cancer Sister        breast  . Thyroid disease Sister        unremoved, unclear reason    Medications- reviewed and updated Current Outpatient Medications  Medication Sig Dispense Refill  . atorvastatin (LIPITOR) 20 MG tablet TAKE ONE-HALF TABLET BY MOUTH ONCE DAILY 15 tablet 13  . ibuprofen (ADVIL,MOTRIN) 200 MG tablet Take 200 mg by mouth 2 (two) times daily.    Marland Kitchen levothyroxine (SYNTHROID, LEVOTHROID) 125 MCG tablet TAKE ONE TABLET BY MOUTH ONCE DAILY 30 tablet 13   No current facility-administered medications for this visit.     Allergies-reviewed and updated No Known Allergies  Social History   Socioeconomic History  . Marital status: Single    Spouse  name: None  . Number of children: None  . Years of education: None  . Highest education level: None  Social Needs  . Financial resource strain: None  . Food insecurity - worry: None  . Food insecurity - inability: None  . Transportation needs - medical: None  . Transportation needs - non-medical: None  Occupational History  . None  Tobacco Use  . Smoking status: Current Every Day Smoker    Packs/day: 1.00    Years: 43.00    Pack years: 43.00    Types: Cigarettes  . Smokeless tobacco: Never Used  . Tobacco comment: Currently smoker 1/2 ppd. Counseled on quitting  Substance and Sexual Activity  . Alcohol use: Yes    Alcohol/week: 0.6 oz    Types: 1 Glasses of wine per week  . Drug use: No  . Sexual activity: None  Other Topics Concern  . None  Social History Narrative   Divorced. No children.    Lives with sister and husband who live in his basement. Has another sister that lives with him in his house.       Retired from [employer] office.       Hobbies: yardwork, Architect work             Objective: BP 114/68 (BP Location: Left Arm, Patient Position: Sitting, Cuff Size: Large)   Pulse 89   Temp 98.4 F (  36.9 C) (Oral)   Ht 5' 8.25" (1.734 m)   Wt 161 lb (73 kg)   SpO2 92%   BMI 24.30 kg/m  Gen: NAD, resting comfortably HEENT: Mucous membranes are moist. Oropharynx normal Neck: no thyromegaly CV: RRR no murmurs rubs or gallops Lungs: CTAB no crackles, wheeze, rhonchi Abdomen: soft/nontender/nondistended/normal bowel sounds. No rebound or guarding.  Ext: no edema Skin: warm, dry Neuro: grossly normal, moves all extremities, PERRLA Rectal: normal tone, normal sized prostate, no masses or tenderness  Assessment/Plan:  66 y.o. male presenting for annual physical.  Health Maintenance counseling: 1. Anticipatory guidance: Patient counseled regarding regular dental exams q6 months, eye exams yearly, wearing seatbelts.  2. Risk factor reduction:  Advised patient  of need for regular exercise and diet rich and fruits and vegetables to reduce risk of heart attack and stroke. Exercise- goal 150 minutes a week. Diet-reasonable diet- maintaining weight.  Wt Readings from Last 3 Encounters:  07/11/17 161 lb (73 kg)  06/16/16 164 lb 6.4 oz (74.6 kg)  04/20/16 160 lb (72.6 kg)  3. Immunizations/screenings/ancillary studies- flu shot given today. HCV and HIV screen today. Tdap today.  Immunization History  Administered Date(s) Administered  . Influenza,inj,Quad PF,6+ Mos 05/11/2014, 06/16/2016  . Influenza,inj,quad, With Preservative 06/11/2015  . Pneumococcal Polysaccharide-23 04/04/2013  . Td 09/17/2006  . Zoster 04/04/2013  4. Prostate cancer screening- low risk based off PSA trend and rectal exam . Update psa today.  Lab Results  Component Value Date   PSA 1.0 06/06/2016   PSA 1.23 06/04/2015   PSA 0.9 12/20/2010   5. Colon cancer screening - 10/02/06 with 10 year follow up. Hemorrhoid tags are hard to clean so he would prefer no colonoscopy. No family history colon cancer. Will do cologuard 6. Skin cancer screening- squamous cell skin cancer treated at [facility] surgery center in September (referred to dermatology last year). advised regular sunscreen use. Denies worrisome, changing, or new skin lesions.  7. Lung cancer screening-enrolled in yearly screening. In January had lesion that was suspicious- 3 month repeat was reassuring- RADs category 2 with planned yearly follow up  Status of chronic or acute concerns  Still with atypical chest pain- pop in chest then goes away- likely costochondritis  Dyslipidemia HLD - update lipids on atorvastatin 10mg  (half of 20mg  tablet)- last year LDL at goal under 100. Discussed potentially doing full tablet with his resistance to quitting smoking, aortic atherosclerosis- would prefer LDL under 70  Hypothyroidism Hypothyroidism- controlled on 125 mcg in past- update tsh Lab Results  Component Value Date   TSH 2.51  06/06/2016    TOBACCO USE Tobacco use- still 1/2 PPD - refuses to quit. Discussed emphysema findings on CT scan- this does not motivate him either  Aortic atherosclerosis ([place]) Aortic atherosclerosis- noted on CT with lung cancer screening. Discussed modifiying risk factors like lipids and smoking  1 year physical  Orders Placed This Encounter  Procedures  . CBC    [place]  . Comprehensive metabolic panel    [place]    Order Specific Question:   Has the patient fasted?    Answer:   No  . Lipid panel    [place]    Order Specific Question:   Has the patient fasted?    Answer:   No  . TSH    [place]  . Hepatitis C antibody  . PSA  . HIV antibody  . POCT Urinalysis Dipstick (Automated)    Standing Status:   Future  Standing Expiration Date:   08/10/2017   Return precautions advised.  Garret Reddish, MD

## 2017-07-11 NOTE — Assessment & Plan Note (Signed)
HLD - update lipids on atorvastatin 10mg  (half of 20mg  tablet)- last year LDL at goal under 100. Discussed potentially doing full tablet with his resistance to quitting smoking, aortic atherosclerosis- would prefer LDL under 70

## 2017-07-11 NOTE — Assessment & Plan Note (Signed)
Hypothyroidism- controlled on 125 mcg in past- update tsh Lab Results  Component Value Date   TSH 2.51 06/06/2016

## 2017-07-11 NOTE — Assessment & Plan Note (Signed)
Tobacco use- still 1/2 PPD - refuses to quit. Discussed emphysema findings on CT scan- this does not motivate him either

## 2017-07-12 LAB — COMPREHENSIVE METABOLIC PANEL
AG RATIO: 1.7 (calc) (ref 1.0–2.5)
ALKALINE PHOSPHATASE (APISO): 54 U/L (ref 40–115)
ALT: 19 U/L (ref 9–46)
AST: 14 U/L (ref 10–35)
Albumin: 4.3 g/dL (ref 3.6–5.1)
BUN: 14 mg/dL (ref 7–25)
CHLORIDE: 103 mmol/L (ref 98–110)
CO2: 28 mmol/L (ref 20–32)
Calcium: 9.5 mg/dL (ref 8.6–10.3)
Creat: 1.16 mg/dL (ref 0.70–1.25)
GLOBULIN: 2.5 g/dL (ref 1.9–3.7)
Glucose, Bld: 105 mg/dL — ABNORMAL HIGH (ref 65–99)
POTASSIUM: 4.1 mmol/L (ref 3.5–5.3)
Sodium: 138 mmol/L (ref 135–146)
Total Bilirubin: 0.5 mg/dL (ref 0.2–1.2)
Total Protein: 6.8 g/dL (ref 6.1–8.1)

## 2017-07-12 LAB — URINALYSIS, MICROSCOPIC ONLY
Bacteria, UA: NONE SEEN /HPF
Hyaline Cast: NONE SEEN /LPF
Squamous Epithelial / LPF: NONE SEEN /HPF (ref <=5)
WBC UA: NONE SEEN /HPF (ref 0–5)

## 2017-07-12 LAB — LIPID PANEL
CHOLESTEROL: 172 mg/dL (ref <200)
HDL: 36 mg/dL — ABNORMAL LOW (ref >40)
LDL Cholesterol (Calc): 111 mg/dL (calc) — ABNORMAL HIGH
Non-HDL Cholesterol (Calc): 136 mg/dL (calc) — ABNORMAL HIGH (ref <130)
TRIGLYCERIDES: 132 mg/dL (ref <150)
Total CHOL/HDL Ratio: 4.8 (calc) (ref <5.0)

## 2017-07-12 LAB — CBC
HCT: 40.4 % (ref 38.5–50.0)
Hemoglobin: 14.2 g/dL (ref 13.2–17.1)
MCH: 31.3 pg (ref 27.0–33.0)
MCHC: 35.1 g/dL (ref 32.0–36.0)
MCV: 89 fL (ref 80.0–100.0)
MPV: 9.6 fL (ref 7.5–12.5)
PLATELETS: 301 10*3/uL (ref 140–400)
RBC: 4.54 10*6/uL (ref 4.20–5.80)
RDW: 12.3 % (ref 11.0–15.0)
WBC: 6.9 10*3/uL (ref 3.8–10.8)

## 2017-07-12 LAB — PSA: PSA: 0.8 ng/mL (ref <=4.0)

## 2017-07-12 LAB — HIV ANTIBODY (ROUTINE TESTING W REFLEX): HIV: NONREACTIVE

## 2017-07-12 LAB — TSH: TSH: 2.29 m[IU]/L (ref 0.40–4.50)

## 2017-07-12 LAB — HEPATITIS C ANTIBODY
Hepatitis C Ab: NONREACTIVE
SIGNAL TO CUT-OFF: 0.01 (ref <1.00)

## 2017-07-16 ENCOUNTER — Other Ambulatory Visit: Payer: Self-pay | Admitting: Family Medicine

## 2017-07-16 MED ORDER — ATORVASTATIN CALCIUM 20 MG PO TABS: 20.0000 mg | ORAL_TABLET | Freq: Every day | ORAL | 3 refills | Status: DC

## 2017-07-24 LAB — COLOGUARD: Cologuard: POSITIVE

## 2017-08-01 ENCOUNTER — Ambulatory Visit: Payer: Self-pay

## 2017-08-01 NOTE — Telephone Encounter (Signed)
Pt. reported a fall outside, yesterday, at approx. 2:30 PM.  He reported he received a cut to the tip of his right ring finger, and a much smaller break in skin of right middle fingertip.  Stated the cut on ring finger is 1/4 " below the fingernail on the tip, and  approx. 1/4-3/8" in length, and opened about 1/4".   Stated he slipped and was unsure if he cut it on the ice, or if it happened from the sidewalk, when he tried to break the fall.  Stated he cleaned it with H202 initially, and then showered, and re-cleaned it with H202, and covered with a bandage.  Reported there is some bloody drainage under the bandage that he applied at about 8:30 PM, last night; denied active bleeding; reported hasn't needed to change or reinforce the bandage, from last night.  Denied fever/ chills. Denies swelling and pain in finger.  Appt. given for PCP eval. 12/13.  Encouraged to keep the wound clean and dry until eval. In the office.  Care advice per protocol.  Pt. Verb. Understanding.         Reason for Disposition . [1] MODERATE-SEVERE pain AND [2] blood present under a nail  Answer Assessment - Initial Assessment Questions 1. MECHANISM: "How did the injury happen?"      Fell yesterday; right ring finger cut; very small break in skin of tip of middle finger. 2. ONSET: "When did the injury happen?" (Minutes or hours ago)      12/11 about 2:30 PM  3. LOCATION: "What part of the finger is injured?" "Is the nail damaged?"      No damage to nail; tip of middle finger cut 4. APPEARANCE of the INJURY: "What does the injury look like?"      Covered with bandage; stated the cut was open; reported not any swelling  5. SEVERITY: "Can you use the hand normally?"  "Can you bend your fingers into a ball and then fully open them?"     Has mobility with his right hand fingers. 6. SIZE: For cuts, bruises, or swelling, ask: "How large is it?" (e.g., inches or centimeters;  entire finger)      Approx. 1/4-3/8 " in length 7.  PAIN: "Is there pain?" If so, ask: "How bad is the pain?"    (e.g., Scale 1-10; or mild, moderate, severe)     Not much discomfort 8. TETANUS: For any breaks in the skin, ask: "When was the last tetanus booster?"     Last month 06/2017 9. OTHER SYMPTOMS: "Do you have any other symptoms?"     Some spotting of blood on bandage since last night about 8:30 pm; no fever/ chills  10. PREGNANCY: "Is there any chance you are pregnant?" "When was your last menstrual period?"     n/a  Protocols used: FINGER INJURY-A-AH

## 2017-08-02 ENCOUNTER — Encounter: Payer: Self-pay | Admitting: Family Medicine

## 2017-08-02 ENCOUNTER — Ambulatory Visit: Payer: No Typology Code available for payment source | Admitting: Family Medicine

## 2017-08-02 VITALS — BP 120/78 | HR 67 | Temp 97.8°F | Ht 68.2 in | Wt 161.8 lb

## 2017-08-02 DIAGNOSIS — S61214A Laceration without foreign body of right ring finger without damage to nail, initial encounter: Secondary | ICD-10-CM | POA: Diagnosis not present

## 2017-08-02 MED ORDER — CEPHALEXIN 500 MG PO CAPS: 500.0000 mg | ORAL_CAPSULE | Freq: Three times a day (TID) | ORAL | 0 refills | Status: AC

## 2017-08-02 NOTE — Patient Instructions (Signed)
Out of an abundance of precaution- will use 5 days of antibiotics though does not appear hand is infected- is a high risk wound though  Really looks like your body is doing a good job healing the wound. This far out we cannot put sutures in it as would increase risk of infection- your body will have to heal on its own  I would go really easy over next 10 days- keep area dressed- if you are sitting around for a while can let it air dry but please avoid hitting it.   See [place] back for worsening pain, expanding redness, fever, or any other concerns

## 2017-08-02 NOTE — Progress Notes (Signed)
Subjective:  Joseph Ford is a 66 y.o. year old very pleasant male patient who presents for/with See problem oriented charting ROS- no fever, chills, expanding redness over fingers. Does have fingertip pain   Past Medical History-  Patient Active Problem List   Diagnosis Date Noted  . Atypical chest pain suspect costochondritis but risk factors concerning 12/31/2014    Priority: High  . TOBACCO USE 09/17/2006    Priority: High  . Hypothyroidism 12/31/2014    Priority: Medium  . Dyslipidemia 09/17/2006    Priority: Medium  . Aortic atherosclerosis ([place]) 07/11/2017  . History of squamous cell carcinoma of skin 10/18/2016    Medications- reviewed and updated Current Outpatient Medications  Medication Sig Dispense Refill  . atorvastatin (LIPITOR) 20 MG tablet Take 1 tablet (20 mg total) by mouth daily. 90 tablet 3  . ibuprofen (ADVIL,MOTRIN) 200 MG tablet Take 200 mg by mouth 2 (two) times daily.    Marland Kitchen levothyroxine (SYNTHROID, LEVOTHROID) 125 MCG tablet TAKE ONE TABLET BY MOUTH ONCE DAILY 30 tablet 13   No current facility-administered medications for this visit.     Objective: BP 120/78 (BP Location: Left Arm, Patient Position: Sitting, Cuff Size: Large)   Pulse 67   Temp 97.8 F (36.6 C) (Oral)   Ht 5' 8.2" (1.732 m)   Wt 161 lb 12.8 oz (73.4 kg)   SpO2 95%   BMI 24.46 kg/m  Gen: NAD, resting comfortably, smells of smoke CV: RRR no murmurs rubs or gallops Lungs: CTAB no crackles, wheeze, rhonchi Ext: no edema Skin: warm, dry Neuro:  Intact distal sensation on right hand MSK: right 4th finger with > 1 cm laceration and < 61mm smaller less deep laceration on 3rd finger. 4th finger laceration approximates well- only some slight serous drainage- area  About 2 mm in regards to open laceration area .   Assessment/Plan:  Laceration of right ring finger without foreign body without damage to nail, initial encounter S: Right handed gentleman who fell on ice just under 48  hours ago and had a laceration on 4th finger with minor laceration on 3rd. Slipped backwards- not sure if hit ice or sidewalk. Immediate pain- did not seek care at time but held pressure and able to stop bleeding. Changing dressing close to daily. Pain is mild to moderate at times- he did shovel his driveway yesterday with firm glove on- recommended against heavy lifting or any potential new trauma for finger. No treatments tried other than wrapping the finger A/P:right ring finger laceration- more minor wound on middle finger. UP to date on Tdap as of last month thankfully. Wound will have to heal by secondary intention. Will cover with 5 day of keflex due to high risk location or wound though no signs of infection at present. Also see AVS instructions.   Patient Instructions  Out of an abundance of precaution- will use 5 days of antibiotics though does not appear hand is infected- is a high risk wound though  Really looks like your body is doing a good job healing the wound. This far out we cannot put sutures in it as would increase risk of infection- your body will have to heal on its own  I would go really easy over next 10 days- keep area dressed- if you are sitting around for a while can let it air dry but please avoid hitting it.   See [place] back for worsening pain, expanding redness, fever, or any other concerns  Meds ordered this  encounter  Medications  . cephALEXin (KEFLEX) 500 MG capsule    Sig: Take 1 capsule (500 mg total) by mouth 3 (three) times daily for 5 days.    Dispense:  15 capsule    Refill:  0   Return precautions advised.  Garret Reddish, MD

## 2017-08-15 ENCOUNTER — Encounter: Payer: Self-pay | Admitting: Family Medicine

## 2017-08-15 ENCOUNTER — Telehealth: Payer: Self-pay

## 2017-08-15 DIAGNOSIS — R195 Other fecal abnormalities: Secondary | ICD-10-CM

## 2017-08-15 NOTE — Telephone Encounter (Signed)
Called and spoke with patient. I informed him of his positive Cologuard results. I in formed him that we had placed a referral to GI for a Colonoscopy. Informed patient that their office would be reaching out to him to get him scheduled. He verbalized understanding.

## 2017-08-16 NOTE — Telephone Encounter (Signed)
Perfect- thanks Roselyn Reef

## 2017-08-28 ENCOUNTER — Telehealth: Payer: Self-pay | Admitting: Family Medicine

## 2017-08-28 NOTE — Telephone Encounter (Signed)
See note

## 2017-08-28 NOTE — Telephone Encounter (Signed)
Copied from [place] [id number]. Topic: Quick Communication - See Telephone Encounter >> Aug 28, 2017  3:13 PM Vernona Rieger wrote: CRM for notification. See Telephone encounter for:   08/28/17. [employer] would like to know if the office received the cologuard result Call back [phone]

## 2017-08-29 NOTE — Telephone Encounter (Signed)
Called Chief Strategy Officer and made them aware we had received the results on 08/15/17.

## 2017-10-18 ENCOUNTER — Encounter: Payer: Self-pay | Admitting: Family Medicine

## 2017-12-12 ENCOUNTER — Ambulatory Visit (INDEPENDENT_AMBULATORY_CARE_PROVIDER_SITE_OTHER)
Admission: RE | Admit: 2017-12-12 | Discharge: 2017-12-12 | Disposition: A | Discharge status: Home / Self Care | Payer: No Typology Code available for payment source | Source: Ambulatory Visit | Attending: Acute Care | Admitting: Acute Care

## 2017-12-12 DIAGNOSIS — F17210 Nicotine dependence, cigarettes, uncomplicated: Secondary | ICD-10-CM | POA: Diagnosis not present

## 2017-12-18 ENCOUNTER — Other Ambulatory Visit: Payer: Self-pay | Admitting: Acute Care

## 2017-12-18 DIAGNOSIS — Z122 Encounter for screening for malignant neoplasm of respiratory organs: Secondary | ICD-10-CM

## 2017-12-18 DIAGNOSIS — Z87891 Personal history of nicotine dependence: Secondary | ICD-10-CM

## 2018-02-15 ENCOUNTER — Ambulatory Visit: Payer: No Typology Code available for payment source | Admitting: Family Medicine

## 2018-02-15 ENCOUNTER — Encounter: Payer: Self-pay | Admitting: Family Medicine

## 2018-02-15 ENCOUNTER — Ambulatory Visit (INDEPENDENT_AMBULATORY_CARE_PROVIDER_SITE_OTHER): Payer: No Typology Code available for payment source

## 2018-02-15 VITALS — BP 120/82 | HR 95 | Temp 98.5°F | Ht 68.2 in | Wt 170.4 lb

## 2018-02-15 DIAGNOSIS — R51 Headache: Secondary | ICD-10-CM | POA: Diagnosis not present

## 2018-02-15 DIAGNOSIS — M542 Cervicalgia: Secondary | ICD-10-CM

## 2018-02-15 DIAGNOSIS — R519 Headache, unspecified: Secondary | ICD-10-CM

## 2018-02-15 MED ORDER — PREDNISONE 20 MG PO TABS: ORAL_TABLET | ORAL | 0 refills | Status: DC

## 2018-02-15 NOTE — Progress Notes (Signed)
Subjective:  Joseph Ford is a 67 y.o. year old very pleasant male patient who presents for/with See problem oriented charting ROS- no extremity weakness, numbness tingling. No fever or chills. Has not felt ill overall    Past Medical History-  Patient Active Problem List   Diagnosis Date Noted  . Atypical chest pain suspect costochondritis but risk factors concerning 12/31/2014    Priority: High  . Former smoker 09/17/2006    Priority: High  . Hypothyroidism 12/31/2014    Priority: Medium  . Dyslipidemia 09/17/2006    Priority: Medium  . Aortic atherosclerosis ([place]) 07/11/2017  . History of squamous cell carcinoma of skin 10/18/2016    Medications- reviewed and updated Current Outpatient Medications  Medication Sig Dispense Refill  . Magnesium Oxide 250 MG TABS Take 250 mg by mouth daily before supper.    Marland Kitchen atorvastatin (LIPITOR) 20 MG tablet Take 1 tablet (20 mg total) by mouth daily. 90 tablet 3  . ibuprofen (ADVIL,MOTRIN) 200 MG tablet Take 200 mg by mouth 2 (two) times daily.    Marland Kitchen levothyroxine (SYNTHROID, LEVOTHROID) 125 MCG tablet TAKE ONE TABLET BY MOUTH ONCE DAILY 30 tablet 13     Objective: BP 120/82 (BP Location: Left Arm, Patient Position: Sitting, Cuff Size: Normal)   Pulse 95   Temp 98.5 F (36.9 C) (Oral)   Ht 5' 8.2" (1.732 m)   Wt 170 lb 6.4 oz (77.3 kg)   SpO2 95%   BMI 25.76 kg/m  Gen: NAD, resting comfortably CV: RRR no murmurs rubs or gallops Lungs: CTAB no crackles, wheeze, rhonchi Abdomen: soft/nontender/nondistended/normal bowel sounds. No rebound or guarding.  Ext: no edema Skin: warm, dry Neuro: CN II-XII intact- other than slight dyscongjugate gaze with right eye slightly deviated laterally as per baseline, sensation and reflexes normal throughout, 5/5 muscle strength in bilateral upper and lower extremities. Normal finger to nose. Normal rapid alternating movements. No pronator drift. Normal romberg. Normal gait.  MSK: very tight in  posterior neck particularly in upper portions, pain reproduced by turning head or with palpation. Able to touch chin to chest  Assessment/Plan:  Bilateral neck pain - Plan: DG Cervical Spine Complete Occipital headache S: headache for 2 weeks in back of his neck with turning head. Had some mild pains there prior. Hurt for first 3 days consistently about 3-4/10. Seemed to get better but if he leans over or turns his head in the car then he gets a pulling sensation. Feels like Right ear is stopped up. Also feels some fullness in back of scalp sensation. Ibuprofen gives minimal relief.   Pain sitting here right now 0-1/10. With turning can get up to 1-2/10. Denies trauma or fall other than back in December.   Has had headaches in the past sparingly and usually resolves with ibuprofen- might not even be every month.  A/P: bilateral neck pain/posterior headache and some left ear fullness.reassuring neurological exam.  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 We are going to get x-rays of the neck- if there is evidence of arthritis will use a course of prednisone. Otherwise with new headache over age 40- if no improvement on prednisone or if no arthritis- strongly considering neuroimaging vs still trialing the prednisone  Lab/Order associations: Bilateral neck pain - Plan: DG Cervical Spine Complete  Occipital headache  Meds ordered this encounter  Medications  . predniSONE (DELTASONE) 20 MG tablet    Sig: Take 2 pills for 3 days, 1 pill for 4 days    Dispense:  10  tablet    Refill:  0    Return precautions advised.  Garret Reddish, MD

## 2018-02-15 NOTE — Patient Instructions (Addendum)
Please stop by lab/x-ray before you go  I sent in prednisone but hold off taking until we get your results back from x-ray- if arthritis in neck will try prednisone for 7 days  Reassuring neurological exam. Wonder if this could be from arthritis from the neck  I want you to ice your neck 3x a day for 20 minutes. After 3 days I want you to try heat 3x a day for 20 minutes.   If we do not see improvement in your symptoms or they worsen would want to consider neuroimaging with MRI

## 2018-03-08 ENCOUNTER — Encounter: Payer: Self-pay | Admitting: Family Medicine

## 2018-03-08 ENCOUNTER — Other Ambulatory Visit: Payer: Self-pay | Admitting: Family Medicine

## 2018-03-08 ENCOUNTER — Ambulatory Visit: Payer: No Typology Code available for payment source | Admitting: Family Medicine

## 2018-03-08 VITALS — BP 122/68 | HR 100 | Temp 97.5°F | Ht 68.2 in | Wt 170.0 lb

## 2018-03-08 DIAGNOSIS — R079 Chest pain, unspecified: Secondary | ICD-10-CM

## 2018-03-08 DIAGNOSIS — N62 Hypertrophy of breast: Secondary | ICD-10-CM | POA: Diagnosis not present

## 2018-03-08 NOTE — Progress Notes (Signed)
Subjective:  Joseph Ford is a 67 y.o. year old very pleasant male patient who presents for/with See problem oriented charting ROS- left nipple pain. No chest pain or shortness of breath. No headache or blurry vision.    Past Medical History-  Patient Active Problem List   Diagnosis Date Noted  . Atypical chest pain suspect costochondritis but risk factors concerning 12/31/2014    Priority: High  . Former smoker 09/17/2006    Priority: High  . Hypothyroidism 12/31/2014    Priority: Medium  . Dyslipidemia 09/17/2006    Priority: Medium  . Aortic atherosclerosis ([place]) 07/11/2017  . History of squamous cell carcinoma of skin 10/18/2016    Medications- reviewed and updated Current Outpatient Medications  Medication Sig Dispense Refill  . atorvastatin (LIPITOR) 20 MG tablet Take 1 tablet (20 mg total) by mouth daily. 90 tablet 3  . ibuprofen (ADVIL,MOTRIN) 200 MG tablet Take 200 mg by mouth 2 (two) times daily.    Marland Kitchen levothyroxine (SYNTHROID, LEVOTHROID) 125 MCG tablet TAKE ONE TABLET BY MOUTH ONCE DAILY 30 tablet 13  . Magnesium Oxide 250 MG TABS Take 250 mg by mouth daily before supper.     No current facility-administered medications for this visit.     Objective: BP 122/68 (BP Location: Left Arm, Patient Position: Sitting, Cuff Size: Normal)   Pulse 100   Temp (!) 97.5 F (36.4 C) (Oral)   Ht 5' 8.2" (1.732 m)   Wt 170 lb (77.1 kg)   SpO2 95%   BMI 25.70 kg/m  Gen: NAD, resting comfortably CV: RRR no murmurs rubs or gallops Lungs: CTAB no crackles, wheeze, rhonchi Abdomen: soft/nontender Ext: no edema Skin: warm, dry  Right chest/breast normal Left chest/breast with soft about 3 cm enlargement behind nipple. When pressing on this area there is slight tenderness.   Assessment/Plan:  Left-sided chest pain - Plan: MM DIAG BREAST TOMO BILATERAL, CANCELED: MM Digital Diagnostic Unilat L S: left nipple pain noted starting Tuesday. Noted 2/10 pain with touching it.  Improving down to 1/10 but persists. Aching sensation with palpation. Takes ibuprofen for other ailments and elps some.   Older sister with breast cancer removed in 1985- 34 at time. No breast cancer in mom but died in 39s.   A/P: 67 year old male with left nipple pain and enlargement in tissue behind the nipple- suspect gynecomastia but unilateral nature concerns me as well as sisters history- for that reason we will get mammogram and/or ultrasound- will use guidance from experts at breast center.   Future Appointments  Date Time Provider [place]  03/11/2018 12:40 PM [facility] DIAG TOMO 1 [facility]-BRE[facility]  03/11/2018 12:50 PM [facility] [place] 1 [facility]  08/05/2018  9:30 AM Hunter, Brayton Mars, MD [id number]   Lab/Order associations: Left-sided chest pain - Plan: MM DIAG BREAST TOMO BILATERAL, CANCELED: MM Digital Diagnostic Unilat L  Gynecomastia - Plan: MM DIAG BREAST TOMO BILATERAL, CANCELED: MM Digital Diagnostic Unilat L  Return precautions advised.  Garret Reddish, MD

## 2018-03-08 NOTE — Patient Instructions (Addendum)
We will get a mammogram to evaluate further. Radiology may change this to ultrasound.   Glad pain is improving- please let me know if symptoms worsen or you have new symptoms such as exertional chest pain or shortness of breath

## 2018-03-11 ENCOUNTER — Other Ambulatory Visit: Payer: Self-pay | Admitting: Family Medicine

## 2018-03-11 ENCOUNTER — Ambulatory Visit
Admission: RE | Admit: 2018-03-11 | Discharge: 2018-03-11 | Disposition: A | Discharge status: Home / Self Care | Payer: No Typology Code available for payment source | Source: Ambulatory Visit | Attending: Family Medicine | Admitting: Family Medicine

## 2018-03-11 ENCOUNTER — Ambulatory Visit: Payer: No Typology Code available for payment source | Admitting: Family Medicine

## 2018-03-11 DIAGNOSIS — N62 Hypertrophy of breast: Secondary | ICD-10-CM

## 2018-03-11 DIAGNOSIS — R079 Chest pain, unspecified: Secondary | ICD-10-CM

## 2018-05-07 ENCOUNTER — Encounter: Payer: Self-pay | Admitting: Family Medicine

## 2018-05-07 ENCOUNTER — Ambulatory Visit (INDEPENDENT_AMBULATORY_CARE_PROVIDER_SITE_OTHER): Payer: No Typology Code available for payment source | Admitting: Family Medicine

## 2018-05-07 VITALS — BP 138/78 | HR 98 | Temp 97.8°F | Ht 68.2 in | Wt 170.8 lb

## 2018-05-07 DIAGNOSIS — H9192 Unspecified hearing loss, left ear: Secondary | ICD-10-CM

## 2018-05-07 DIAGNOSIS — M542 Cervicalgia: Secondary | ICD-10-CM

## 2018-05-07 DIAGNOSIS — Z23 Encounter for immunization: Secondary | ICD-10-CM | POA: Diagnosis not present

## 2018-05-07 MED ORDER — PREDNISONE 20 MG PO TABS: ORAL_TABLET | ORAL | 0 refills | Status: DC

## 2018-05-07 NOTE — Progress Notes (Signed)
Subjective:  Joseph Ford is a 67 y.o. year old very pleasant male patient who presents for/with See problem oriented charting ROS- hearing loss on left, some muffled sounds on right. No fever or chills. Some neck pain and posterior/lower headache still in musculature from neck.    Past Medical History-  Patient Active Problem List   Diagnosis Date Noted  . Atypical chest pain suspect costochondritis but risk factors concerning 12/31/2014    Priority: High  . Former smoker 09/17/2006    Priority: High  . Hypothyroidism 12/31/2014    Priority: Medium  . Dyslipidemia 09/17/2006    Priority: Medium  . Aortic atherosclerosis ([place]) 07/11/2017  . History of squamous cell carcinoma of skin 10/18/2016    Medications- reviewed and updated Current Outpatient Medications  Medication Sig Dispense Refill  . atorvastatin (LIPITOR) 20 MG tablet Take 1 tablet (20 mg total) by mouth daily. 90 tablet 3  . ibuprofen (ADVIL,MOTRIN) 200 MG tablet Take 200 mg by mouth 2 (two) times daily.    Marland Kitchen levothyroxine (SYNTHROID, LEVOTHROID) 125 MCG tablet TAKE ONE TABLET BY MOUTH ONCE DAILY 30 tablet 13  . Magnesium Oxide 250 MG TABS Take 250 mg by mouth daily before supper.    . predniSONE (DELTASONE) 20 MG tablet Take 2 pills for 3 days, 1 pill for 4 days 10 tablet 0   No current facility-administered medications for this visit.     Objective: BP 138/78 (BP Location: Left Arm, Patient Position: Sitting, Cuff Size: Normal)   Pulse 98   Temp 97.8 F (36.6 C) (Oral)   Ht 5' 8.2" (1.732 m)   Wt 170 lb 12.8 oz (77.5 kg)   SpO2 96%   BMI 25.82 kg/m  Gen: NAD, resting comfortably Ear canals clear bilaterally, TM normal bilaterally without clear air fluid levels.  CV: RRR no murmurs rubs or gallops Lungs: CTAB no crackles, wheeze, rhonchi Ext: no edema Skin: warm, dry  Assessment/Plan:  Hearing loss of left ear, unspecified hearing loss type - Plan: Ambulatory referral to ENT Bilateral neck pain S:   Patient seen in June complaining of bilateral neck pain as well as posterior headache and some left ear fullness. Had reassuring neuro exam at that time. 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 Trial of prednisone and had complete resolution. No hearing loss last time. Occasionally getting some mild frontal headaches which are mild and come and go.   Symptoms started back around middle of august- dull ache in back of the neck. Evenings worsen then go back to a dull pain before bed. Worst of pain is 6-7/10. Right now about 1/10.   Friday noted sudden hearing loss and had associated fullness in the ear. Yesterday could hear better but today has worsened. Getting roaring sensation in left ear. Right ear is noting some distorted hearing.  A/P: New hearing loss in left and muffled ear sounds in right- suspect OME but cannot tell concretely on exam so will refer to ENT. Auto insufflation doesn't improve symptoms.  I wonder if is OME if this is caused by allergies. 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 Hoping ENT can see him within the week- entered as urgent.   Future Appointments  Date Time Provider [place]  08/05/2018  9:30 AM Marin Olp, MD [facility]   Lab/Order associations: Hearing loss of left ear, unspecified hearing loss type -  Plan: Ambulatory referral to ENT  Bilateral neck pain  Need for prophylactic vaccination against Streptococcus pneumoniae (pneumococcus) - Plan: Pneumococcal conjugate vaccine 13-valent  Need for prophylactic vaccination and inoculation against influenza - Plan: Flu vaccine HIGH DOSE PF (Fluzone High dose)  Meds ordered this encounter  Medications  . predniSONE (DELTASONE) 20 MG tablet    Sig: Take 2 pills for 3 days, 1 pill for 4 days    Dispense:  10 tablet    Refill:  0    Return precautions advised. See  avs  Garret Reddish, MD

## 2018-05-07 NOTE — Patient Instructions (Addendum)
1.flu shot and prevnar 13 today 2. Trial prednisone for 7 days starting tomorrow to see if it helps with the neck. If allergies are causing the ear issues- prednisone would calm those down and hopefully promote drainage.  3. We will call you within a week about your referral to ENT/ear nose and throat doctors. If you do not hear within 2 weeks, give [place] a call.  4. Let us know if you have new or worsening symptoms prior to referral to ear doctor.

## 2018-07-12 ENCOUNTER — Ambulatory Visit: Payer: No Typology Code available for payment source | Admitting: Family Medicine

## 2018-07-12 ENCOUNTER — Encounter: Payer: Self-pay | Admitting: Family Medicine

## 2018-07-12 VITALS — BP 112/76 | HR 91 | Temp 98.5°F | Ht 68.0 in | Wt 173.6 lb

## 2018-07-12 DIAGNOSIS — I700 Atherosclerosis of aorta: Secondary | ICD-10-CM

## 2018-07-12 DIAGNOSIS — E039 Hypothyroidism, unspecified: Secondary | ICD-10-CM | POA: Diagnosis not present

## 2018-07-12 DIAGNOSIS — R51 Headache: Secondary | ICD-10-CM

## 2018-07-12 DIAGNOSIS — E785 Hyperlipidemia, unspecified: Secondary | ICD-10-CM

## 2018-07-12 DIAGNOSIS — R519 Headache, unspecified: Secondary | ICD-10-CM

## 2018-07-12 DIAGNOSIS — Z6826 Body mass index (BMI) 26.0-26.9, adult: Secondary | ICD-10-CM

## 2018-07-12 NOTE — Patient Instructions (Addendum)
Please stop by lab before you go  We will call you within two weeks about your referral for MRI brain. If you do not hear within 3 weeks, give [place] a call.   If you have worsening symptoms please let us know

## 2018-07-12 NOTE — Progress Notes (Addendum)
Subjective:  Joseph Ford is a 67 y.o. year old very pleasant male patient who presents for/with See problem oriented charting ROS- No facial or extremity weakness. No slurred words or trouble swallowing. no blurry vision or double vision. No paresthesias. No confusion or word finding difficulties. No chest pain or shortness of breath. Does have regular headaches   Past Medical History-  Patient Active Problem List   Diagnosis Date Noted  . Atypical chest pain suspect costochondritis but risk factors concerning 12/31/2014    Priority: High  . Former smoker 09/17/2006    Priority: High  . Hypothyroidism 12/31/2014    Priority: Medium  . Dyslipidemia 09/17/2006    Priority: Medium  . Aortic atherosclerosis ([place]) 07/11/2017  . History of squamous cell carcinoma of skin 10/18/2016    Medications- reviewed and updated Current Outpatient Medications  Medication Sig Dispense Refill  . atorvastatin (LIPITOR) 20 MG tablet Take 1 tablet (20 mg total) by mouth daily. 90 tablet 3  . ibuprofen (ADVIL,MOTRIN) 200 MG tablet Take 200 mg by mouth 2 (two) times daily.    Marland Kitchen levothyroxine (SYNTHROID, LEVOTHROID) 125 MCG tablet TAKE ONE TABLET BY MOUTH ONCE DAILY 30 tablet 13   No current facility-administered medications for this visit.     Objective: BP 112/76 (BP Location: Left Arm, Patient Position: Sitting, Cuff Size: Large)   Pulse 91   Temp 98.5 F (36.9 C) (Oral)   Ht 5\' 8"  (1.727 m)   Wt 173 lb 9.6 oz (78.7 kg)   SpO2 91%   BMI 26.40 kg/m  Gen: NAD, resting comfortably CV: RRR no murmurs rubs or gallops Lungs: CTAB no crackles, wheeze, rhonchi Ext: no edema Skin: warm, dry Neuro: CN II-XII intact, sensation and reflexes normal throughout, 5/5 muscle strength in bilateral upper and lower extremities. Normal finger to nose. Normal rapid alternating movements. No pronator drift. Normal romberg. Normal gait.  MSK: some tension in musculature of upper neck- mild pain with  palpation  Assessment/Plan:  Nonintractable episodic headache - Plan: MR Brain W Wo Contrast S: Patient presented back in June with bilateral neck pain and occipital headaches-seemed to be worse with turning his neck.  Has also had intermittent issues with fullness of ears.  X-rays showed cervical arthritis.  Had complete resolution of symptoms with prednisone.  Does have a history of mild intermittent headaches-but usually these resolve with Tylenol  In September presented again with bilateral neck pain as well as posterior headaches and ear fullness.  He also noted hearing loss in both ears-roaring sensation in left ear-distorted hearing in the right.  We thought possible otitis media with effusion related to allergies- we referred him to ENT and gave him another round of prednisone.  I do not see any records back from ENT- he states "everything checked out good".   Patient is still having occipital headaches and these did not resolve with the 2nd round of prednisone whereas other symptoms resolved. Neck pain is somewhat better but still having some. Headaches seem to come and go but seem to be present at least once a day. Usually last 3-4 hours - pain last night was severe as it was Wednesday evening as well. Hard to get to sleep but didn't wake him. No history of trauma and not on blood thinners.   Tylenol 3x a day and aleve PM at night- both give some relief from headaches   Headaches now going on  intermittently since June. Stable pattern/not improving.  A/P: 67 year old male posterior/occipital  headaches on and off since June.  This could be related to cervical arthritis given symptoms improve with courses of prednisone- but did not resolve with recent course.  Given this is a new headache pattern beyond age 59- feel we need to move forward with MRI of the brain to rule out mass.  I would strongly consider sports medicine referral if this is reassuring-would also consider neurology referral  after that.  Could discuss both options with patient's after imaging.  Dyslipidemia S:  controlled on atorvastatin 20 mg Lab Results  Component Value Date   CHOL 138 07/12/2018   HDL 37 (L) 07/12/2018   LDLCALC 79 07/12/2018   LDLDIRECT 97 05/11/2014   TRIG 122 07/12/2018   CHOLHDL 3.7 07/12/2018   A/P: Stable on updated lipid panel today- we went ahead and ordered this ahead of his physical since he was going to need labs for MRI potentially  Hypothyroidism S: On thyroid medication-levothyroxine 125 mcg Lab Results  Component Value Date   TSH 2.29 07/11/2017   A/P: Has been stable- realized after visit did not order TSH with labs as intended.-We will discuss at physical whether he wants to test do that test at that time    Aortic atherosclerosis ([place]) S: Has known aortic atherosclerosis A/P: The risk factor modification-particularly his lipids-which are well controlled today  Future Appointments  Date Time Provider [place]  08/05/2018  9:30 AM Marin Olp, MD [facility]  Physical already planned next month  Lab/Order associations: Nonintractable episodic headache, unspecified headache type - Plan: MR Brain W Wo Contrast  Dyslipidemia - Plan: Urinalysis, CBC, Comprehensive metabolic panel, Lipid panel, Lipid panel, Comprehensive metabolic panel, CBC, Urinalysis, CANCELED: CBC, CANCELED: Comprehensive metabolic panel, CANCELED: Lipid panel, CANCELED: Urinalysis, CANCELED: Urinalysis  Return precautions advised.  Garret Reddish, MD

## 2018-07-13 LAB — CBC
HCT: 39.8 % (ref 38.5–50.0)
Hemoglobin: 13.9 g/dL (ref 13.2–17.1)
MCH: 31.7 pg (ref 27.0–33.0)
MCHC: 34.9 g/dL (ref 32.0–36.0)
MCV: 90.7 fL (ref 80.0–100.0)
MPV: 10.1 fL (ref 7.5–12.5)
Platelets: 282 10*3/uL (ref 140–400)
RBC: 4.39 10*6/uL (ref 4.20–5.80)
RDW: 12.6 % (ref 11.0–15.0)
WBC: 6.5 10*3/uL (ref 3.8–10.8)

## 2018-07-13 LAB — URINALYSIS
Bilirubin Urine: NEGATIVE
GLUCOSE, UA: NEGATIVE
HGB URINE DIPSTICK: NEGATIVE
KETONES UR: NEGATIVE
LEUKOCYTES UA: NEGATIVE
Nitrite: NEGATIVE
PROTEIN: NEGATIVE
Specific Gravity, Urine: 1.009 (ref 1.001–1.03)
pH: 5.5 (ref 5.0–8.0)

## 2018-07-13 LAB — COMPREHENSIVE METABOLIC PANEL
AG Ratio: 1.9 (calc) (ref 1.0–2.5)
ALBUMIN MSPROF: 4.5 g/dL (ref 3.6–5.1)
ALKALINE PHOSPHATASE (APISO): 51 U/L (ref 40–115)
ALT: 20 U/L (ref 9–46)
AST: 15 U/L (ref 10–35)
BILIRUBIN TOTAL: 0.5 mg/dL (ref 0.2–1.2)
BUN: 17 mg/dL (ref 7–25)
CALCIUM: 9.7 mg/dL (ref 8.6–10.3)
CO2: 28 mmol/L (ref 20–32)
Chloride: 104 mmol/L (ref 98–110)
Creat: 0.92 mg/dL (ref 0.70–1.25)
Globulin: 2.4 g/dL (calc) (ref 1.9–3.7)
Glucose, Bld: 101 mg/dL — ABNORMAL HIGH (ref 65–99)
Potassium: 4.4 mmol/L (ref 3.5–5.3)
Sodium: 139 mmol/L (ref 135–146)
Total Protein: 6.9 g/dL (ref 6.1–8.1)

## 2018-07-13 LAB — LIPID PANEL
CHOL/HDL RATIO: 3.7 (calc) (ref <5.0)
CHOLESTEROL: 138 mg/dL (ref <200)
HDL: 37 mg/dL — AB (ref >40)
LDL CHOLESTEROL (CALC): 79 mg/dL
Non-HDL Cholesterol (Calc): 101 mg/dL (calc) (ref <130)
Triglycerides: 122 mg/dL (ref <150)

## 2018-07-13 NOTE — Assessment & Plan Note (Signed)
S:  controlled on atorvastatin 20 mg Lab Results  Component Value Date   CHOL 138 07/12/2018   HDL 37 (L) 07/12/2018   LDLCALC 79 07/12/2018   LDLDIRECT 97 05/11/2014   TRIG 122 07/12/2018   CHOLHDL 3.7 07/12/2018   A/P: Stable on updated lipid panel today- we went ahead and ordered this ahead of his physical since he was going to need labs for MRI potentially

## 2018-07-13 NOTE — Assessment & Plan Note (Signed)
S: On thyroid medication-levothyroxine 125 mcg Lab Results  Component Value Date   TSH 2.29 07/11/2017   A/P: Has been stable- realized after visit did not order TSH with labs as intended.-We will discuss at physical whether he wants to test do that test at that time

## 2018-07-13 NOTE — Assessment & Plan Note (Signed)
S: Has known aortic atherosclerosis A/P: The risk factor modification-particularly his lipids-which are well controlled today

## 2018-07-16 ENCOUNTER — Inpatient Hospital Stay (HOSPITAL_COMMUNITY): Payer: Medicare Other | Admitting: Anesthesiology

## 2018-07-16 ENCOUNTER — Inpatient Hospital Stay (HOSPITAL_COMMUNITY)
Admission: EM | Admit: 2018-07-16 | Discharge: 2018-07-19 | DRG: 027 | Disposition: A | Discharge status: Home / Self Care | Payer: Medicare Other | Attending: Neurosurgery | Admitting: Neurosurgery

## 2018-07-16 ENCOUNTER — Emergency Department (HOSPITAL_COMMUNITY): Payer: Medicare Other

## 2018-07-16 ENCOUNTER — Encounter (HOSPITAL_COMMUNITY): Payer: Self-pay

## 2018-07-16 ENCOUNTER — Encounter (HOSPITAL_COMMUNITY)
Admission: EM | Disposition: A | Discharge status: Home / Self Care | Payer: Self-pay | Source: Home / Self Care | Attending: Neurosurgery

## 2018-07-16 ENCOUNTER — Other Ambulatory Visit: Payer: Self-pay

## 2018-07-16 DIAGNOSIS — E785 Hyperlipidemia, unspecified: Secondary | ICD-10-CM | POA: Diagnosis present

## 2018-07-16 DIAGNOSIS — E039 Hypothyroidism, unspecified: Secondary | ICD-10-CM | POA: Diagnosis present

## 2018-07-16 DIAGNOSIS — Z8249 Family history of ischemic heart disease and other diseases of the circulatory system: Secondary | ICD-10-CM

## 2018-07-16 DIAGNOSIS — S065X9A Traumatic subdural hemorrhage with loss of consciousness of unspecified duration, initial encounter: Secondary | ICD-10-CM | POA: Diagnosis present

## 2018-07-16 DIAGNOSIS — Z87891 Personal history of nicotine dependence: Secondary | ICD-10-CM

## 2018-07-16 DIAGNOSIS — I700 Atherosclerosis of aorta: Secondary | ICD-10-CM | POA: Diagnosis present

## 2018-07-16 DIAGNOSIS — Z803 Family history of malignant neoplasm of breast: Secondary | ICD-10-CM

## 2018-07-16 DIAGNOSIS — Z79890 Hormone replacement therapy (postmenopausal): Secondary | ICD-10-CM | POA: Diagnosis not present

## 2018-07-16 DIAGNOSIS — Z79899 Other long term (current) drug therapy: Secondary | ICD-10-CM

## 2018-07-16 DIAGNOSIS — Z8349 Family history of other endocrine, nutritional and metabolic diseases: Secondary | ICD-10-CM | POA: Diagnosis not present

## 2018-07-16 DIAGNOSIS — Z85828 Personal history of other malignant neoplasm of skin: Secondary | ICD-10-CM | POA: Diagnosis not present

## 2018-07-16 DIAGNOSIS — S065XAA Traumatic subdural hemorrhage with loss of consciousness status unknown, initial encounter: Secondary | ICD-10-CM | POA: Diagnosis present

## 2018-07-16 DIAGNOSIS — I6203 Nontraumatic chronic subdural hemorrhage: Principal | ICD-10-CM | POA: Diagnosis present

## 2018-07-16 DIAGNOSIS — Z833 Family history of diabetes mellitus: Secondary | ICD-10-CM | POA: Diagnosis not present

## 2018-07-16 DIAGNOSIS — R51 Headache: Secondary | ICD-10-CM | POA: Diagnosis not present

## 2018-07-16 DIAGNOSIS — Z8679 Personal history of other diseases of the circulatory system: Secondary | ICD-10-CM | POA: Diagnosis present

## 2018-07-16 DIAGNOSIS — Z791 Long term (current) use of non-steroidal anti-inflammatories (NSAID): Secondary | ICD-10-CM

## 2018-07-16 HISTORY — PX: CRANIOTOMY: SHX93

## 2018-07-16 LAB — I-STAT CHEM 8, ED
BUN: 21 mg/dL (ref 8–23)
CREATININE: 1 mg/dL (ref 0.61–1.24)
Calcium, Ion: 1.16 mmol/L (ref 1.15–1.40)
Chloride: 104 mmol/L (ref 98–111)
Glucose, Bld: 111 mg/dL — ABNORMAL HIGH (ref 70–99)
HEMATOCRIT: 45 % (ref 39.0–52.0)
Hemoglobin: 15.3 g/dL (ref 13.0–17.0)
POTASSIUM: 4.1 mmol/L (ref 3.5–5.1)
SODIUM: 138 mmol/L (ref 135–145)
TCO2: 26 mmol/L (ref 22–32)

## 2018-07-16 LAB — TYPE AND SCREEN
ABO/RH(D): AB POS
Antibody Screen: NEGATIVE

## 2018-07-16 LAB — CBC WITH DIFFERENTIAL/PLATELET
Abs Immature Granulocytes: 0.03 10*3/uL (ref 0.00–0.07)
BASOS ABS: 0 10*3/uL (ref 0.0–0.1)
Basophils Relative: 0 %
Eosinophils Absolute: 0.1 10*3/uL (ref 0.0–0.5)
Eosinophils Relative: 1 %
HEMATOCRIT: 45 % (ref 39.0–52.0)
Hemoglobin: 14.5 g/dL (ref 13.0–17.0)
IMMATURE GRANULOCYTES: 0 %
LYMPHS ABS: 1.2 10*3/uL (ref 0.7–4.0)
LYMPHS PCT: 16 %
MCH: 30.3 pg (ref 26.0–34.0)
MCHC: 32.2 g/dL (ref 30.0–36.0)
MCV: 93.9 fL (ref 80.0–100.0)
Monocytes Absolute: 0.4 10*3/uL (ref 0.1–1.0)
Monocytes Relative: 5 %
NEUTROS PCT: 78 %
NRBC: 0 % (ref 0.0–0.2)
Neutro Abs: 5.9 10*3/uL (ref 1.7–7.7)
Platelets: 315 10*3/uL (ref 150–400)
RBC: 4.79 MIL/uL (ref 4.22–5.81)
RDW: 12.2 % (ref 11.5–15.5)
WBC: 7.7 10*3/uL (ref 4.0–10.5)

## 2018-07-16 LAB — PROTIME-INR
INR: 1.12
Prothrombin Time: 14.3 s (ref 11.4–15.2)

## 2018-07-16 LAB — BASIC METABOLIC PANEL
ANION GAP: 8 (ref 5–15)
BUN: 17 mg/dL (ref 8–23)
CHLORIDE: 106 mmol/L (ref 98–111)
CO2: 24 mmol/L (ref 22–32)
Calcium: 9.8 mg/dL (ref 8.9–10.3)
Creatinine, Ser: 1.04 mg/dL (ref 0.61–1.24)
GFR calc non Af Amer: >60 mL/min (ref >60)
Glucose, Bld: 114 mg/dL — ABNORMAL HIGH (ref 70–99)
Potassium: 4.1 mmol/L (ref 3.5–5.1)
Sodium: 138 mmol/L (ref 135–145)

## 2018-07-16 LAB — SURGICAL PCR SCREEN
MRSA, PCR: NEGATIVE
STAPHYLOCOCCUS AUREUS: NEGATIVE

## 2018-07-16 LAB — ABO/RH: ABO/RH(D): AB POS

## 2018-07-16 LAB — SEDIMENTATION RATE: SED RATE: 17 mm/h — AB (ref 0–16)

## 2018-07-16 SURGERY — CRANIOTOMY HEMATOMA EVACUATION SUBDURAL
Anesthesia: General | Site: Head | Laterality: Left

## 2018-07-16 MED ORDER — FENTANYL CITRATE (PF) 250 MCG/5ML IJ SOLN
INTRAMUSCULAR | Status: AC
  Filled 2018-07-16: qty 5

## 2018-07-16 MED ORDER — LEVOTHYROXINE SODIUM 25 MCG PO TABS
125.0000 ug | ORAL_TABLET | Freq: Every day | ORAL | Status: DC
  Administered 2018-07-17 – 2018-07-19 (×3): 125 ug via ORAL
  Filled 2018-07-16 (×4): qty 1

## 2018-07-16 MED ORDER — FENTANYL CITRATE (PF) 250 MCG/5ML IJ SOLN
INTRAMUSCULAR | Status: DC | PRN
  Administered 2018-07-16: 50 ug via INTRAVENOUS
  Administered 2018-07-16 (×2): 100 ug via INTRAVENOUS

## 2018-07-16 MED ORDER — FENTANYL CITRATE (PF) 100 MCG/2ML IJ SOLN: 25.0000 ug | INTRAMUSCULAR | Status: DC | PRN

## 2018-07-16 MED ORDER — LEVETIRACETAM IN NACL 500 MG/100ML IV SOLN: 500.0000 mg | Freq: Two times a day (BID) | INTRAVENOUS | Status: DC

## 2018-07-16 MED ORDER — FLEET ENEMA 7-19 GM/118ML RE ENEM: 1.0000 | ENEMA | Freq: Once | RECTAL | Status: DC | PRN

## 2018-07-16 MED ORDER — SUGAMMADEX SODIUM 200 MG/2ML IV SOLN
INTRAVENOUS | Status: DC | PRN
  Administered 2018-07-16: 200 mg via INTRAVENOUS

## 2018-07-16 MED ORDER — PROMETHAZINE HCL 12.5 MG PO TABS
12.5000 mg | ORAL_TABLET | ORAL | Status: DC | PRN
  Filled 2018-07-16: qty 2

## 2018-07-16 MED ORDER — SODIUM CHLORIDE 0.9% FLUSH
3.0000 mL | Freq: Two times a day (BID) | INTRAVENOUS | Status: DC
  Administered 2018-07-16: 3 mL via INTRAVENOUS

## 2018-07-16 MED ORDER — LIDOCAINE 2% (20 MG/ML) 5 ML SYRINGE
INTRAMUSCULAR | Status: AC
  Filled 2018-07-16: qty 5

## 2018-07-16 MED ORDER — BUPIVACAINE HCL (PF) 0.5 % IJ SOLN
INTRAMUSCULAR | Status: DC | PRN
  Administered 2018-07-16: 4 mL

## 2018-07-16 MED ORDER — THROMBIN 20000 UNITS EX SOLR
CUTANEOUS | Status: DC | PRN
  Administered 2018-07-16: 20 mL via TOPICAL

## 2018-07-16 MED ORDER — SODIUM CHLORIDE 0.9 % IR SOLN
Status: DC | PRN
  Administered 2018-07-16: 3000 mL

## 2018-07-16 MED ORDER — HYDROMORPHONE HCL 1 MG/ML IJ SOLN
1.0000 mg | INTRAMUSCULAR | Status: DC | PRN
  Administered 2018-07-16 (×2): 1 mg via INTRAVENOUS
  Filled 2018-07-16: qty 1

## 2018-07-16 MED ORDER — ONDANSETRON HCL 4 MG PO TABS: 4.0000 mg | ORAL_TABLET | Freq: Four times a day (QID) | ORAL | Status: DC | PRN

## 2018-07-16 MED ORDER — ACETAMINOPHEN 325 MG PO TABS: 650.0000 mg | ORAL_TABLET | ORAL | Status: DC | PRN

## 2018-07-16 MED ORDER — ROCURONIUM BROMIDE 10 MG/ML (PF) SYRINGE
PREFILLED_SYRINGE | INTRAVENOUS | Status: DC | PRN
  Administered 2018-07-16: 50 mg via INTRAVENOUS

## 2018-07-16 MED ORDER — BISACODYL 5 MG PO TBEC: 5.0000 mg | DELAYED_RELEASE_TABLET | Freq: Every day | ORAL | Status: DC | PRN

## 2018-07-16 MED ORDER — MORPHINE SULFATE (PF) 2 MG/ML IV SOLN: 1.0000 mg | INTRAVENOUS | Status: DC | PRN

## 2018-07-16 MED ORDER — SODIUM CHLORIDE 0.9 % IV SOLN
INTRAVENOUS | Status: DC | PRN
  Administered 2018-07-16: 500 mL

## 2018-07-16 MED ORDER — LIDOCAINE-EPINEPHRINE 1 %-1:100000 IJ SOLN
INTRAMUSCULAR | Status: AC
  Filled 2018-07-16: qty 1

## 2018-07-16 MED ORDER — LIDOCAINE 2% (20 MG/ML) 5 ML SYRINGE
INTRAMUSCULAR | Status: DC | PRN
  Administered 2018-07-16: 60 mg via INTRAVENOUS

## 2018-07-16 MED ORDER — ONDANSETRON HCL 4 MG/2ML IJ SOLN: 4.0000 mg | INTRAMUSCULAR | Status: DC | PRN

## 2018-07-16 MED ORDER — CEFAZOLIN SODIUM-DEXTROSE 2-4 GM/100ML-% IV SOLN
2.0000 g | INTRAVENOUS | Status: DC
  Filled 2018-07-16: qty 100

## 2018-07-16 MED ORDER — 0.9 % SODIUM CHLORIDE (POUR BTL) OPTIME
TOPICAL | Status: DC | PRN
  Administered 2018-07-16 (×2): 1000 mL

## 2018-07-16 MED ORDER — MIDAZOLAM HCL 2 MG/2ML IJ SOLN
INTRAMUSCULAR | Status: AC
  Filled 2018-07-16: qty 2

## 2018-07-16 MED ORDER — BACITRACIN ZINC 500 UNIT/GM EX OINT
TOPICAL_OINTMENT | CUTANEOUS | Status: DC | PRN
  Administered 2018-07-16: 1 via TOPICAL

## 2018-07-16 MED ORDER — THROMBIN (RECOMBINANT) 20000 UNITS EX SOLR
CUTANEOUS | Status: AC
  Filled 2018-07-16: qty 20000

## 2018-07-16 MED ORDER — BUPIVACAINE HCL (PF) 0.5 % IJ SOLN
INTRAMUSCULAR | Status: AC
  Filled 2018-07-16: qty 30

## 2018-07-16 MED ORDER — ACETAMINOPHEN 650 MG RE SUPP: 650.0000 mg | RECTAL | Status: DC | PRN

## 2018-07-16 MED ORDER — LIDOCAINE-EPINEPHRINE 1 %-1:100000 IJ SOLN
INTRAMUSCULAR | Status: DC | PRN
  Administered 2018-07-16: 4 mL via INTRADERMAL

## 2018-07-16 MED ORDER — DEXAMETHASONE SODIUM PHOSPHATE 10 MG/ML IJ SOLN
INTRAMUSCULAR | Status: DC | PRN
  Administered 2018-07-16: 10 mg via INTRAVENOUS

## 2018-07-16 MED ORDER — SENNOSIDES-DOCUSATE SODIUM 8.6-50 MG PO TABS: 1.0000 | ORAL_TABLET | Freq: Every evening | ORAL | Status: DC | PRN

## 2018-07-16 MED ORDER — ONDANSETRON HCL 4 MG/2ML IJ SOLN
INTRAMUSCULAR | Status: AC
  Filled 2018-07-16: qty 2

## 2018-07-16 MED ORDER — MICROFIBRILLAR COLL HEMOSTAT EX PADS
MEDICATED_PAD | CUTANEOUS | Status: DC | PRN
  Administered 2018-07-16: 1 via TOPICAL

## 2018-07-16 MED ORDER — ACETAMINOPHEN 500 MG PO TABS: 1000.0000 mg | ORAL_TABLET | Freq: Once | ORAL | Status: DC | PRN

## 2018-07-16 MED ORDER — SODIUM CHLORIDE 0.9 % IV SOLN
INTRAVENOUS | Status: DC
  Administered 2018-07-16: 17:00:00 via INTRAVENOUS

## 2018-07-16 MED ORDER — LEVETIRACETAM IN NACL 1000 MG/100ML IV SOLN
1000.0000 mg | Freq: Once | INTRAVENOUS | Status: AC
  Administered 2018-07-16: 1000 mg via INTRAVENOUS
  Filled 2018-07-16: qty 100

## 2018-07-16 MED ORDER — ONDANSETRON HCL 4 MG/2ML IJ SOLN
INTRAMUSCULAR | Status: DC | PRN
  Administered 2018-07-16: 4 mg via INTRAVENOUS

## 2018-07-16 MED ORDER — PROPOFOL 10 MG/ML IV BOLUS
INTRAVENOUS | Status: DC | PRN
  Administered 2018-07-16: 40 mg via INTRAVENOUS
  Administered 2018-07-16: 110 mg via INTRAVENOUS

## 2018-07-16 MED ORDER — ONDANSETRON HCL 4 MG/2ML IJ SOLN
4.0000 mg | Freq: Once | INTRAMUSCULAR | Status: AC
  Administered 2018-07-16: 4 mg via INTRAVENOUS
  Filled 2018-07-16: qty 2

## 2018-07-16 MED ORDER — ACETAMINOPHEN 10 MG/ML IV SOLN: 1000.0000 mg | Freq: Once | INTRAVENOUS | Status: DC | PRN

## 2018-07-16 MED ORDER — SODIUM CHLORIDE 0.9% FLUSH
3.0000 mL | Freq: Two times a day (BID) | INTRAVENOUS | Status: DC
  Administered 2018-07-16 – 2018-07-17 (×2): 3 mL via INTRAVENOUS

## 2018-07-16 MED ORDER — BACITRACIN ZINC 500 UNIT/GM EX OINT
TOPICAL_OINTMENT | CUTANEOUS | Status: AC
  Filled 2018-07-16: qty 28.35

## 2018-07-16 MED ORDER — CEFAZOLIN SODIUM-DEXTROSE 2-3 GM-%(50ML) IV SOLR
INTRAVENOUS | Status: DC | PRN
  Administered 2018-07-16: 2 g via INTRAVENOUS

## 2018-07-16 MED ORDER — PHENYLEPHRINE 40 MCG/ML (10ML) SYRINGE FOR IV PUSH (FOR BLOOD PRESSURE SUPPORT)
PREFILLED_SYRINGE | INTRAVENOUS | Status: AC
  Filled 2018-07-16: qty 10

## 2018-07-16 MED ORDER — CEFAZOLIN SODIUM-DEXTROSE 2-4 GM/100ML-% IV SOLN
2.0000 g | Freq: Three times a day (TID) | INTRAVENOUS | Status: AC
  Administered 2018-07-16 – 2018-07-17 (×2): 2 g via INTRAVENOUS
  Filled 2018-07-16 (×2): qty 100

## 2018-07-16 MED ORDER — NALOXONE HCL 0.4 MG/ML IJ SOLN: 0.0800 mg | INTRAMUSCULAR | Status: DC | PRN

## 2018-07-16 MED ORDER — ATORVASTATIN CALCIUM 10 MG PO TABS
20.0000 mg | ORAL_TABLET | Freq: Every day | ORAL | Status: DC
  Administered 2018-07-17 – 2018-07-18 (×2): 20 mg via ORAL
  Filled 2018-07-16: qty 1
  Filled 2018-07-16 (×2): qty 2

## 2018-07-16 MED ORDER — SODIUM CHLORIDE 0.9 % IV SOLN
INTRAVENOUS | Status: DC | PRN
  Administered 2018-07-16 (×2): via INTRAVENOUS

## 2018-07-16 MED ORDER — ZOLPIDEM TARTRATE 5 MG PO TABS: 5.0000 mg | ORAL_TABLET | Freq: Every evening | ORAL | Status: DC | PRN

## 2018-07-16 MED ORDER — OXYCODONE HCL 5 MG/5ML PO SOLN: 5.0000 mg | Freq: Once | ORAL | Status: DC | PRN

## 2018-07-16 MED ORDER — THROMBIN 5000 UNITS EX SOLR
OROMUCOSAL | Status: DC | PRN
  Administered 2018-07-16: 5 mL via TOPICAL

## 2018-07-16 MED ORDER — THROMBIN 5000 UNITS EX SOLR
CUTANEOUS | Status: AC
  Filled 2018-07-16: qty 5000

## 2018-07-16 MED ORDER — HYDROMORPHONE HCL 1 MG/ML IJ SOLN
0.5000 mg | INTRAMUSCULAR | Status: DC | PRN
  Filled 2018-07-16: qty 1

## 2018-07-16 MED ORDER — LEVETIRACETAM IN NACL 500 MG/100ML IV SOLN
500.0000 mg | Freq: Two times a day (BID) | INTRAVENOUS | Status: DC
  Administered 2018-07-16 – 2018-07-19 (×6): 500 mg via INTRAVENOUS
  Filled 2018-07-16 (×6): qty 100

## 2018-07-16 MED ORDER — MORPHINE SULFATE (PF) 4 MG/ML IV SOLN
4.0000 mg | Freq: Once | INTRAVENOUS | Status: AC
  Administered 2018-07-16: 4 mg via INTRAVENOUS
  Filled 2018-07-16: qty 1

## 2018-07-16 MED ORDER — IOPAMIDOL (ISOVUE-370) INJECTION 76%
INTRAVENOUS | Status: AC
  Filled 2018-07-16: qty 50

## 2018-07-16 MED ORDER — SODIUM CHLORIDE 0.9% FLUSH: 3.0000 mL | INTRAVENOUS | Status: DC | PRN

## 2018-07-16 MED ORDER — LABETALOL HCL 5 MG/ML IV SOLN: 10.0000 mg | INTRAVENOUS | Status: DC | PRN

## 2018-07-16 MED ORDER — HYDROCODONE-ACETAMINOPHEN 5-325 MG PO TABS: 1.0000 | ORAL_TABLET | ORAL | Status: DC | PRN

## 2018-07-16 MED ORDER — ACETAMINOPHEN 160 MG/5ML PO SOLN: 1000.0000 mg | Freq: Once | ORAL | Status: DC | PRN

## 2018-07-16 MED ORDER — HYDROCODONE-ACETAMINOPHEN 5-325 MG PO TABS
1.0000 | ORAL_TABLET | ORAL | Status: DC | PRN
  Filled 2018-07-16: qty 1

## 2018-07-16 MED ORDER — DOCUSATE SODIUM 100 MG PO CAPS
100.0000 mg | ORAL_CAPSULE | Freq: Two times a day (BID) | ORAL | Status: DC
  Administered 2018-07-16: 100 mg via ORAL

## 2018-07-16 MED ORDER — TRANEXAMIC ACID-NACL 1000-0.7 MG/100ML-% IV SOLN
1000.0000 mg | INTRAVENOUS | Status: DC
  Filled 2018-07-16: qty 100

## 2018-07-16 MED ORDER — ONDANSETRON HCL 4 MG/2ML IJ SOLN: 4.0000 mg | Freq: Four times a day (QID) | INTRAMUSCULAR | Status: DC | PRN

## 2018-07-16 MED ORDER — ONDANSETRON HCL 4 MG PO TABS: 4.0000 mg | ORAL_TABLET | ORAL | Status: DC | PRN

## 2018-07-16 MED ORDER — OXYCODONE HCL 5 MG PO TABS: 5.0000 mg | ORAL_TABLET | Freq: Once | ORAL | Status: DC | PRN

## 2018-07-16 MED ORDER — ROCURONIUM BROMIDE 50 MG/5ML IV SOSY
PREFILLED_SYRINGE | INTRAVENOUS | Status: AC
  Filled 2018-07-16: qty 5

## 2018-07-16 MED ORDER — DOCUSATE SODIUM 100 MG PO CAPS
100.0000 mg | ORAL_CAPSULE | Freq: Two times a day (BID) | ORAL | Status: DC
  Administered 2018-07-17 (×2): 100 mg via ORAL
  Filled 2018-07-16 (×5): qty 1

## 2018-07-16 MED ORDER — SODIUM CHLORIDE 0.9 % IV SOLN: 250.0000 mL | INTRAVENOUS | Status: DC | PRN

## 2018-07-16 MED ORDER — DEXAMETHASONE SODIUM PHOSPHATE 10 MG/ML IJ SOLN
INTRAMUSCULAR | Status: AC
  Filled 2018-07-16: qty 1

## 2018-07-16 MED ORDER — ACETAMINOPHEN 325 MG PO TABS: 650.0000 mg | ORAL_TABLET | Freq: Four times a day (QID) | ORAL | Status: DC | PRN

## 2018-07-16 MED ORDER — SODIUM CHLORIDE 0.9 % IV SOLN
INTRAVENOUS | Status: DC
  Administered 2018-07-16: 23:00:00 via INTRAVENOUS

## 2018-07-16 MED ORDER — ACETAMINOPHEN 650 MG RE SUPP: 650.0000 mg | Freq: Four times a day (QID) | RECTAL | Status: DC | PRN

## 2018-07-16 MED ORDER — PROPOFOL 10 MG/ML IV BOLUS
INTRAVENOUS | Status: AC
  Filled 2018-07-16: qty 20

## 2018-07-16 MED ORDER — IOPAMIDOL (ISOVUE-370) INJECTION 76%
50.0000 mL | Freq: Once | INTRAVENOUS | Status: AC | PRN
  Administered 2018-07-16: 50 mL via INTRAVENOUS

## 2018-07-16 SURGICAL SUPPLY — 72 items
APL SKNCLS STERI-STRIP NONHPOA (GAUZE/BANDAGES/DRESSINGS)
BATTERY IQ STERILE (MISCELLANEOUS) ×1 IMPLANT
BENZOIN TINCTURE PRP APPL 2/3 (GAUZE/BANDAGES/DRESSINGS) IMPLANT
BLADE CLIPPER SURG (BLADE) ×2 IMPLANT
BNDG GAUZE ELAST 4 BULKY (GAUZE/BANDAGES/DRESSINGS) IMPLANT
BTRY SRG DRVR 1.5 IQ (MISCELLANEOUS) ×1
BUR ACORN 6.0 PRECISION (BURR) ×2 IMPLANT
BUR MATCHSTICK NEURO 3.0 LAGG (BURR) IMPLANT
BUR SPIRAL ROUTER 2.3 (BUR) IMPLANT
CANISTER SUCT 3000ML PPV (MISCELLANEOUS) ×2 IMPLANT
CARTRIDGE OIL MAESTRO DRILL (MISCELLANEOUS) ×1 IMPLANT
CLIP VESOCCLUDE MED 6/CT (CLIP) IMPLANT
COVER WAND RF STERILE (DRAPES) ×2 IMPLANT
DIFFUSER DRILL AIR PNEUMATIC (MISCELLANEOUS) ×2 IMPLANT
DRAPE NEUROLOGICAL W/INCISE (DRAPES) ×2 IMPLANT
DRAPE SURG 17X23 STRL (DRAPES) IMPLANT
DRAPE WARM FLUID 44X44 (DRAPE) ×2 IMPLANT
DRSG TELFA 3X8 NADH (GAUZE/BANDAGES/DRESSINGS) ×2 IMPLANT
DURAPREP 6ML APPLICATOR 50/CS (WOUND CARE) ×2 IMPLANT
ELECT REM PT RETURN 9FT ADLT (ELECTROSURGICAL) ×2
ELECTRODE REM PT RTRN 9FT ADLT (ELECTROSURGICAL) ×1 IMPLANT
EVACUATOR 1/8 PVC DRAIN (DRAIN) IMPLANT
EVACUATOR SILICONE 100CC (DRAIN) IMPLANT
GAUZE 4X4 16PLY RFD (DISPOSABLE) IMPLANT
GAUZE SPONGE 4X4 12PLY STRL (GAUZE/BANDAGES/DRESSINGS) ×2 IMPLANT
GLOVE BIO SURGEON STRL SZ7.5 (GLOVE) IMPLANT
GLOVE BIOGEL PI IND STRL 7.5 (GLOVE) ×2 IMPLANT
GLOVE BIOGEL PI INDICATOR 7.5 (GLOVE) ×2
GLOVE ECLIPSE 7.0 STRL STRAW (GLOVE) ×4 IMPLANT
GLOVE EXAM NITRILE XL STR (GLOVE) IMPLANT
GOWN STRL REUS W/ TWL LRG LVL3 (GOWN DISPOSABLE) ×2 IMPLANT
GOWN STRL REUS W/ TWL XL LVL3 (GOWN DISPOSABLE) IMPLANT
GOWN STRL REUS W/TWL 2XL LVL3 (GOWN DISPOSABLE) IMPLANT
GOWN STRL REUS W/TWL LRG LVL3 (GOWN DISPOSABLE) ×4
GOWN STRL REUS W/TWL XL LVL3 (GOWN DISPOSABLE)
HEMOSTAT POWDER KIT SURGIFOAM (HEMOSTASIS) ×2 IMPLANT
HEMOSTAT SURGICEL 2X14 (HEMOSTASIS) IMPLANT
KIT BASIN OR (CUSTOM PROCEDURE TRAY) ×2 IMPLANT
KIT TURNOVER KIT B (KITS) ×2 IMPLANT
NEEDLE HYPO 22GX1.5 SAFETY (NEEDLE) ×2 IMPLANT
NS IRRIG 1000ML POUR BTL (IV SOLUTION) ×2 IMPLANT
OIL CARTRIDGE MAESTRO DRILL (MISCELLANEOUS) ×2
PACK CRANIOTOMY CUSTOM (CUSTOM PROCEDURE TRAY) ×2 IMPLANT
PAD DRESSING TELFA 3X8 NADH (GAUZE/BANDAGES/DRESSINGS) IMPLANT
PATTIES SURGICAL .5 X.5 (GAUZE/BANDAGES/DRESSINGS) IMPLANT
PATTIES SURGICAL .5 X3 (DISPOSABLE) IMPLANT
PATTIES SURGICAL 1X1 (DISPOSABLE) IMPLANT
PLATE 1.5  2HOLE LNG NEURO (Plate) ×2 IMPLANT
PLATE 1.5 2HOLE LNG NEURO (Plate) IMPLANT
PLATE 1.5/0.5 18.5MM BURR HOLE (Plate) ×2 IMPLANT
SCREW SELF DRILL HT 1.5/4MM (Screw) ×9 IMPLANT
SET CYSTO W/LG BORE CLAMP LF (SET/KITS/TRAYS/PACK) ×1 IMPLANT
SPONGE NEURO XRAY DETECT 1X3 (DISPOSABLE) IMPLANT
SPONGE SURGIFOAM ABS GEL 100 (HEMOSTASIS) ×2 IMPLANT
STAPLER VISISTAT 35W (STAPLE) ×2 IMPLANT
STOCKINETTE 6  STRL (DRAPES) ×1
STOCKINETTE 6 STRL (DRAPES) ×1 IMPLANT
SUT ETHILON 3 0 FSL (SUTURE) IMPLANT
SUT ETHILON 3 0 PS 1 (SUTURE) IMPLANT
SUT NURALON 4 0 TR CR/8 (SUTURE) ×6 IMPLANT
SUT STEEL 0 (SUTURE)
SUT STEEL 0 18XMFL TIE 17 (SUTURE) IMPLANT
SUT VIC AB 0 CT1 18XCR BRD8 (SUTURE) ×2 IMPLANT
SUT VIC AB 0 CT1 8-18 (SUTURE) ×4
SUT VIC AB 3-0 SH 8-18 (SUTURE) ×4 IMPLANT
TAPE CLOTH 1X10 TAN NS (GAUZE/BANDAGES/DRESSINGS) ×2 IMPLANT
TOWEL GREEN STERILE (TOWEL DISPOSABLE) ×2 IMPLANT
TOWEL GREEN STERILE FF (TOWEL DISPOSABLE) ×2 IMPLANT
TRAY FOLEY MTR SLVR 16FR STAT (SET/KITS/TRAYS/PACK) ×2 IMPLANT
TUBE CONNECTING 12X1/4 (SUCTIONS) ×2 IMPLANT
UNDERPAD 30X30 (UNDERPADS AND DIAPERS) ×2 IMPLANT
WATER STERILE IRR 1000ML POUR (IV SOLUTION) ×2 IMPLANT

## 2018-07-16 NOTE — Transfer of Care (Signed)
Immediate Anesthesia Transfer of Care Note  Patient: Joseph Ford  Procedure(s) Performed: CRANIOTOMY HEMATOMA EVACUATION SUBDURAL (Left Head)  Patient Location: PACU  Anesthesia Type:General  Level of Consciousness: drowsy, pateint uncooperative, confused and responds to stimulation  Airway & Oxygen Therapy: Patient Spontanous Breathing  Post-op Assessment: Report given to RN, Post -op Vital signs reviewed and stable and Patient moving all extremities X 4  Post vital signs: Reviewed and stable  Last Vitals:  Vitals Value Taken Time  BP 171/97 07/16/2018  9:51 PM  Temp 36.3 C 07/16/2018  9:51 PM  Pulse 119 07/16/2018  9:54 PM  Resp 22 07/16/2018  9:54 PM  SpO2 93 % 07/16/2018  9:54 PM  Vitals shown include unvalidated device data.  Last Pain:  Vitals:   07/16/18 2151  TempSrc:   PainSc: (P) Asleep         Complications: No apparent anesthesia complications

## 2018-07-16 NOTE — Anesthesia Procedure Notes (Signed)
Procedure Name: Intubation Date/Time: 07/16/2018 8:11 PM Performed by: Claris Che, CRNA Pre-anesthesia Checklist: Patient identified, Emergency Drugs available, Suction available, Patient being monitored and Timeout performed Patient Re-evaluated:Patient Re-evaluated prior to induction Oxygen Delivery Method: Circle system utilized Preoxygenation: Pre-oxygenation with 100% oxygen Induction Type: IV induction Ventilation: Mask ventilation without difficulty Laryngoscope Size: Mac and 4 Grade View: Grade III Tube type: Oral Tube size: 7.5 mm Number of attempts: 1 Airway Equipment and Method: Stylet Placement Confirmation: ETT inserted through vocal cords under direct vision,  positive ETCO2 and breath sounds checked- equal and bilateral Secured at: 24 cm Tube secured with: Tape Dental Injury: Teeth and Oropharynx as per pre-operative assessment

## 2018-07-16 NOTE — Anesthesia Preprocedure Evaluation (Signed)
Anesthesia Evaluation  Patient identified by MRN, date of birth, ID band Patient awake    Reviewed: Allergy & Precautions, NPO status , Patient's Chart, lab work & pertinent test results  History of Anesthesia Complications Negative for: history of anesthetic complications  Airway Mallampati: III  TM Distance: >3 FB Neck ROM: Full    Dental  (+) Poor Dentition, Missing   Pulmonary former smoker,    breath sounds clear to auscultation       Cardiovascular negative cardio ROS   Rhythm:Regular     Neuro/Psych Subdural negative psych ROS   GI/Hepatic negative GI ROS, Neg liver ROS,   Endo/Other  Hypothyroidism   Renal/GU negative Renal ROS  negative genitourinary   Musculoskeletal negative musculoskeletal ROS (+)   Abdominal   Peds negative pediatric ROS (+)  Hematology negative hematology ROS (+)   Anesthesia Other Findings   Reproductive/Obstetrics                             Anesthesia Physical Anesthesia Plan  ASA: II  Anesthesia Plan: General   Post-op Pain Management:    Induction: Intravenous  PONV Risk Score and Plan: 2 and Ondansetron and Dexamethasone  Airway Management Planned: Oral ETT  Additional Equipment: None  Intra-op Plan:   Post-operative Plan: Extubation in OR  Informed Consent: I have reviewed the patients History and Physical, chart, labs and discussed the procedure including the risks, benefits and alternatives for the proposed anesthesia with the patient or authorized representative who has indicated his/her understanding and acceptance.   Dental advisory given  Plan Discussed with: CRNA and Surgeon  Anesthesia Plan Comments:         Anesthesia Quick Evaluation

## 2018-07-16 NOTE — ED Provider Notes (Signed)
[facility] EMERGENCY DEPARTMENT Provider Note   CSN: [id number] Arrival date & time: 07/16/18  1021     History   Chief Complaint Chief Complaint  Patient presents with  . Headache    HPI Joseph Ford is a 67 y.o. male.  He is presenting here with a severe headache that is been going on since Friday.  Sounds like he is been troubled with on and off headaches for a few months which acutely became more severe on Friday.  Said Friday his headache started gradually but has been increasingly more painful and has been miserable since then.  Its occipital it radiates around the right more than the left to the frontal area.  It is constant but there are sharp stabbing episodes that are more severe.  He has had some pain with opening his mouth.  Not associated with any blurry vision double vision numbness or weakness.  No fevers or chills nausea or vomiting.  He had actually seen his PCP on Friday for the more chronic headache and was getting set up for an MRI.  His sister lives here in the same house and she has no symptoms.  Doubt carbon monoxide, they have monitors.  The history is provided by the patient and a relative.  Headache   This is a new problem. Episode onset: 4 days. The problem occurs constantly. The problem has not changed since onset.The headache is associated with nothing. The pain is located in the occipital and bilateral region. The pain is severe. The pain does not radiate. Pertinent negatives include no fever, no syncope, no shortness of breath, no nausea and no vomiting. He has tried NSAIDs for the symptoms. The treatment provided no relief.    Past Medical History:  Diagnosis Date  . Hyperlipidemia   . Hypothyroidism     Patient Active Problem List   Diagnosis Date Noted  . Aortic atherosclerosis ([place]) 07/11/2017  . History of squamous cell carcinoma of skin 10/18/2016  . Atypical chest pain suspect costochondritis but risk factors concerning  12/31/2014  . Hypothyroidism 12/31/2014  . Dyslipidemia 09/17/2006  . Former smoker 09/17/2006    Past Surgical History:  Procedure Laterality Date  . left eye surgery  1959   at [facility] Medications    Prior to Admission medications   Medication Sig Start Date End Date Taking? Authorizing Provider  atorvastatin (LIPITOR) 20 MG tablet Take 1 tablet (20 mg total) by mouth daily. 07/16/17   Marin Olp, MD  ibuprofen (ADVIL,MOTRIN) 200 MG tablet Take 200 mg by mouth 2 (two) times daily.    [provider]  levothyroxine (SYNTHROID, LEVOTHROID) 125 MCG tablet TAKE ONE TABLET BY MOUTH ONCE DAILY 06/25/17   Marin Olp, MD    Family History Family History  Problem Relation Age of Onset  . Hypertension Mother   . Heart disease Mother 39       died of heart attack  . Heart attack Father   . Heart disease Father 62       "mild heart attack"  . Cancer Sister        breast  . Breast cancer Sister 69  . Diabetes Maternal Aunt   . Diabetes Maternal Grandmother   . Thyroid disease Sister        unremoved, unclear reason    Social History Social History   Tobacco Use  . Smoking status: Former Smoker  Packs/day: 0.60    Years: 56.00    Pack years: 33.60    Types: Cigarettes    Last attempt to quit: 09/05/2017    Years since quitting: 0.8  . Smokeless tobacco: Never Used  Substance Use Topics  . Alcohol use: Yes    Alcohol/week: 1.0 standard drinks    Types: 1 Glasses of wine per week  . Drug use: No     Allergies   Patient has no known allergies.   Review of Systems Review of Systems  Constitutional: Negative for fever.  Eyes: Negative for visual disturbance.  Respiratory: Negative for shortness of breath.   Cardiovascular: Negative for syncope.  Gastrointestinal: Negative for nausea and vomiting.  Genitourinary: Negative for hematuria.  Musculoskeletal: Negative for back pain.  Skin: Negative for rash.    Neurological: Positive for headaches.     Physical Exam Updated Vital Signs BP 124/88 (BP Location: Right Arm)   Pulse 75   Temp 98.3 F (36.8 C) (Oral)   Resp 20   SpO2 96%   Physical Exam  Constitutional: He is oriented to person, place, and time. He appears well-developed and well-nourished. He appears distressed.  HENT:  Head: Normocephalic and atraumatic.  Eyes: Conjunctivae and EOM are normal. No scleral icterus. Right pupil is round. Left pupil is round.  Neck: Neck supple. No neck rigidity.  Cardiovascular: Normal rate, regular rhythm and normal heart sounds.  No murmur heard. Pulmonary/Chest: Effort normal and breath sounds normal. No respiratory distress.  Abdominal: Soft. There is no tenderness.  Musculoskeletal: Normal range of motion. He exhibits no edema or tenderness.  Neurological: He is alert and oriented to person, place, and time. He has normal strength. GCS eye subscore is 4. GCS verbal subscore is 5. GCS motor subscore is 6.  He has some slight anisocoria with the left being 4 mm in the right being 3.  Skin: Skin is warm and dry. Capillary refill takes less than 2 seconds.  Psychiatric: He has a normal mood and affect.  Nursing note and vitals reviewed.    ED Treatments / Results  Labs (all labs ordered are listed, but only abnormal results are displayed) Labs Reviewed  BASIC METABOLIC PANEL - Abnormal; Notable for the following components:      Result Value   Glucose, Bld 114 (*)    All other components within normal limits  SEDIMENTATION RATE - Abnormal; Notable for the following components:   Sed Rate 17 (*)    All other components within normal limits  I-STAT CHEM 8, ED - Abnormal; Notable for the following components:   Glucose, Bld 111 (*)    All other components within normal limits  SURGICAL PCR SCREEN  CBC WITH DIFFERENTIAL/PLATELET  PROTIME-INR  HIV ANTIBODY (ROUTINE TESTING W REFLEX)  TYPE AND SCREEN  ABO/RH     EKG None  Radiology Ct Angio Head W/cm &/or Wo Cm  Result Date: 07/16/2018 CLINICAL DATA:  Severe occipital headache.  Carotid dissection. EXAM: CT ANGIOGRAPHY HEAD AND NECK TECHNIQUE: Multidetector CT imaging of the head and neck was performed using the standard protocol during bolus administration of intravenous contrast. Multiplanar CT image reconstructions and MIPs were obtained to evaluate the vascular anatomy. Carotid stenosis measurements (when applicable) are obtained utilizing NASCET criteria, using the distal internal carotid diameter as the denominator. CONTRAST:  76mL ISOVUE-370 IOPAMIDOL (ISOVUE-370) INJECTION 76% COMPARISON:  None. FINDINGS: CT HEAD FINDINGS Brain: Large subdural hematoma on the left measuring 23 mm in thickness. Mixed density subdural.  Majority is low-density however there are areas of high density compatible with subacute subdural with multiple episodes of intermittent bleeding. 14 mm midline shift to the right. Negative for acute infarct.  Negative for mass or edema. Vascular: Negative for hyperdense vessel Skull: Negative for skull fracture or skull lesion. Sinuses: Negative Orbits: Negative Review of the MIP images confirms the above findings CTA NECK FINDINGS Aortic arch: Standard branching. Imaged portion shows no evidence of aneurysm or dissection. No significant stenosis of the major arch vessel origins. Right carotid system: Right carotid widely patent without stenosis or dissection. Small atherosclerotic plaque carotid bulb. Left carotid system: Left carotid widely patent without stenosis or dissection. Minimal atherosclerotic disease at the bifurcation. Vertebral arteries: Left vertebral dominant. Both vertebral arteries patent to the basilar. Negative for vertebral dissection. Mild calcific stenosis origin of the vertebral artery on the right. Skeleton: No acute skeletal abnormality. Other neck: Negative Upper chest: Partially calcified pleural plaque right  apex unchanged from prior studies. Review of the MIP images confirms the above findings CTA HEAD FINDINGS Anterior circulation: Cavernous carotid widely patent bilaterally without stenosis. Anterior and middle cerebral arteries widely patent bilaterally without stenosis. Posterior circulation: Both vertebral arteries patent to the basilar without stenosis. PICA patent bilaterally. Basilar widely patent. Superior cerebellar and posterior cerebral arteries patent bilaterally without stenosis. Venous sinuses: Patent Anatomic variants: None Delayed phase: Normal enhancement postcontrast administration. Subdural hematoma does not show significant enhancement. Review of the MIP images confirms the above findings IMPRESSION: 1. Large left-sided subdural hematoma 23 mm in thickness with mixed density compatible with subacute blood. Probable multiple episodes of hemorrhage. 14 mm midline shift to the right 2. Negative for carotid dissection. No significant carotid or vertebral artery stenosis. 3. These results were called by telephone at the time of interpretation on 07/16/2018 at 1:37 pm to Dr. Aletta Edouard , who verbally acknowledged these results. Electronically Signed   By: Franchot Gallo M.D.   On: 07/16/2018 13:38   Ct Angio Neck W And/or Wo Contrast  Result Date: 07/16/2018 CLINICAL DATA:  Severe occipital headache.  Carotid dissection. EXAM: CT ANGIOGRAPHY HEAD AND NECK TECHNIQUE: Multidetector CT imaging of the head and neck was performed using the standard protocol during bolus administration of intravenous contrast. Multiplanar CT image reconstructions and MIPs were obtained to evaluate the vascular anatomy. Carotid stenosis measurements (when applicable) are obtained utilizing NASCET criteria, using the distal internal carotid diameter as the denominator. CONTRAST:  19mL ISOVUE-370 IOPAMIDOL (ISOVUE-370) INJECTION 76% COMPARISON:  None. FINDINGS: CT HEAD FINDINGS Brain: Large subdural hematoma on the left  measuring 23 mm in thickness. Mixed density subdural. Majority is low-density however there are areas of high density compatible with subacute subdural with multiple episodes of intermittent bleeding. 14 mm midline shift to the right. Negative for acute infarct.  Negative for mass or edema. Vascular: Negative for hyperdense vessel Skull: Negative for skull fracture or skull lesion. Sinuses: Negative Orbits: Negative Review of the MIP images confirms the above findings CTA NECK FINDINGS Aortic arch: Standard branching. Imaged portion shows no evidence of aneurysm or dissection. No significant stenosis of the major arch vessel origins. Right carotid system: Right carotid widely patent without stenosis or dissection. Small atherosclerotic plaque carotid bulb. Left carotid system: Left carotid widely patent without stenosis or dissection. Minimal atherosclerotic disease at the bifurcation. Vertebral arteries: Left vertebral dominant. Both vertebral arteries patent to the basilar. Negative for vertebral dissection. Mild calcific stenosis origin of the vertebral artery on the right. Skeleton: No acute  skeletal abnormality. Other neck: Negative Upper chest: Partially calcified pleural plaque right apex unchanged from prior studies. Review of the MIP images confirms the above findings CTA HEAD FINDINGS Anterior circulation: Cavernous carotid widely patent bilaterally without stenosis. Anterior and middle cerebral arteries widely patent bilaterally without stenosis. Posterior circulation: Both vertebral arteries patent to the basilar without stenosis. PICA patent bilaterally. Basilar widely patent. Superior cerebellar and posterior cerebral arteries patent bilaterally without stenosis. Venous sinuses: Patent Anatomic variants: None Delayed phase: Normal enhancement postcontrast administration. Subdural hematoma does not show significant enhancement. Review of the MIP images confirms the above findings IMPRESSION: 1. Large  left-sided subdural hematoma 23 mm in thickness with mixed density compatible with subacute blood. Probable multiple episodes of hemorrhage. 14 mm midline shift to the right 2. Negative for carotid dissection. No significant carotid or vertebral artery stenosis. 3. These results were called by telephone at the time of interpretation on 07/16/2018 at 1:37 pm to Dr. Aletta Edouard , who verbally acknowledged these results. Electronically Signed   By: Franchot Gallo M.D.   On: 07/16/2018 13:38    Procedures .Critical Care Performed by: Hayden Rasmussen, MD Authorized by: Hayden Rasmussen, MD   Critical care provider statement:    Critical care time (minutes):  45   Critical care time was exclusive of:  Separately billable procedures and treating other patients   Critical care was necessary to treat or prevent imminent or life-threatening deterioration of the following conditions:  CNS failure or compromise   Critical care was time spent personally by me on the following activities:  Discussions with consultants, evaluation of patient's response to treatment, examination of patient, ordering and performing treatments and interventions, ordering and review of laboratory studies, ordering and review of radiographic studies, pulse oximetry, re-evaluation of patient's condition, obtaining history from patient or surrogate and review of old charts   I assumed direction of critical care for this patient from another provider in my specialty: no     (including critical care time)  Medications Ordered in ED Medications  morphine 4 MG/ML injection 4 mg (has no administration in time range)  ondansetron (ZOFRAN) injection 4 mg (has no administration in time range)     Initial Impression / Assessment and Plan / ED Course  I have reviewed the triage vital signs and the nursing notes.  Pertinent labs & imaging results that were available during my care of the patient were reviewed by me and considered in  my medical decision making (see chart for details).  Clinical Course as of Jul 17 1927  Tue Jul 16, 2018  1400 Received a call from radiology that the patient has a subdural.  I placed a call into neurosurgery.  Of updated the patient and his wife.  More pain medicine ordered.   [MB]  1583 Discussed with neurosurgery who will come to the ED to evaluate the patient.   [MB]    Clinical Course User Index [MB] Hayden Rasmussen, MD      Final Clinical Impressions(s) / ED Diagnoses   Final diagnoses:  Subdural hematoma [facility])    ED Discharge Orders    None       Hayden Rasmussen, MD 07/16/18 1928

## 2018-07-16 NOTE — ED Triage Notes (Signed)
Pt has headache since Friday. States taking oxycodone with some relief. Pt appears in significant pain in triage. Alert and oriented.

## 2018-07-16 NOTE — H&P (Addendum)
Chief Complaint   Chief Complaint  Patient presents with  . Headache    HPI   Consult requested by: Dr Melina Copa Reason for consult: SDH  HPI: Joseph Ford is a 67 y.o. male with history of hyperlipidemia and hypothyroidism who presents with worsening headache. Starting August, he developed mild intermittent occipital headache. He states he was seen and evaluated by his PCP and was prescribed mutliple po medications including steroids which did help alleviate the pain, but only temporarily. Starting Friday, the headache became more severe and consistent. While the severity would fluctuate throughout the day without pattern, he would always have a consistent headache. Headache is severe now. He cannot recall any injury within the last several months, but apparently did slip on ice in December, although unsure of actual head injury. He is not on any blood thinning agents. He denies dizziness, changes in vision, slurring speech, one sided weakness or focal deficits. He sister at bedside who he lives with states she has noticed on two separate occasions recently that he was unable to express himself appropriately. For example, when he was trying to explain what the doctor told him about his headaches to her, he had difficulties with simple word finding.   His last meal was last night, unsure of time.  Patient Active Problem List   Diagnosis Date Noted  . Aortic atherosclerosis ([place]) 07/11/2017  . History of squamous cell carcinoma of skin 10/18/2016  . Atypical chest pain suspect costochondritis but risk factors concerning 12/31/2014  . Hypothyroidism 12/31/2014  . Dyslipidemia 09/17/2006  . Former smoker 09/17/2006    PMH: Past Medical History:  Diagnosis Date  . Hyperlipidemia   . Hypothyroidism     PSH: Past Surgical History:  Procedure Laterality Date  . left eye surgery  1959   at [facility] eyes     (Not in a hospital admission)  SH: Social History   Tobacco  Use  . Smoking status: Former Smoker    Packs/day: 0.60    Years: 56.00    Pack years: 33.60    Types: Cigarettes    Last attempt to quit: 09/05/2017    Years since quitting: 0.8  . Smokeless tobacco: Never Used  Substance Use Topics  . Alcohol use: Yes    Alcohol/week: 1.0 standard drinks    Types: 1 Glasses of wine per week  . Drug use: No    MEDS: Prior to Admission medications   Medication Sig Start Date End Date Taking? Authorizing Provider  atorvastatin (LIPITOR) 20 MG tablet Take 1 tablet (20 mg total) by mouth daily. 07/16/17   Marin Olp, MD  ibuprofen (ADVIL,MOTRIN) 200 MG tablet Take 200 mg by mouth 2 (two) times daily.    [provider]  levothyroxine (SYNTHROID, LEVOTHROID) 125 MCG tablet TAKE ONE TABLET BY MOUTH ONCE DAILY 06/25/17   Marin Olp, MD    ALLERGY: No Known Allergies  Social History   Tobacco Use  . Smoking status: Former Smoker    Packs/day: 0.60    Years: 56.00    Pack years: 33.60    Types: Cigarettes    Last attempt to quit: 09/05/2017    Years since quitting: 0.8  . Smokeless tobacco: Never Used  Substance Use Topics  . Alcohol use: Yes    Alcohol/week: 1.0 standard drinks    Types: 1 Glasses of wine per week     Family History  Problem Relation Age of Onset  . Hypertension Mother   .  Heart disease Mother 51       died of heart attack  . Heart attack Father   . Heart disease Father 65       "mild heart attack"  . Cancer Sister        breast  . Breast cancer Sister 32  . Diabetes Maternal Aunt   . Diabetes Maternal Grandmother   . Thyroid disease Sister        unremoved, unclear reason     ROS   Review of Systems  Constitutional: Negative.   HENT: Negative.   Eyes: Negative.   Respiratory: Negative.   Cardiovascular: Negative.   Genitourinary: Negative.   Musculoskeletal: Positive for neck pain. Negative for back pain, falls, joint pain and myalgias.  Skin: Negative.   Neurological: Positive for  headaches. Negative for dizziness, tingling, tremors, sensory change, speech change, focal weakness, seizures, loss of consciousness and weakness.   Exam   Vitals:   07/16/18 1415 07/16/18 1430  BP: 112/81 120/80  Pulse: 76 84  Resp:  16  Temp:    SpO2: 96% 98%   General appearance: WDWN, NAD Eyes: No scleral injection Cardiovascular: Regular rate and rhythm without murmurs, rubs, gallops. No edema or variciosities. Distal pulses normal. Pulmonary: Effort normal, non-labored breathing Musculoskeletal:     Muscle tone upper extremities: Normal    Muscle tone lower extremities: Normal    Motor exam: Upper Extremities Deltoid Bicep Tricep Grip  Right 5/5 5/5 5/5 5/5  Left 5/5 5/5 5/5 5/5   Lower Extremity IP Quad PF DF EHL  Right 5/5 5/5 5/5 5/5 5/5  Left 5/5 5/5 5/5 5/5 5/5   Neurological Mental Status:    - Patient is awake, alert, oriented to person, place, month, year, and situation    - Patient is able to give a clear and coherent history.    - No signs of aphasia or neglect Cranial Nerves    - II: Visual Fields are full. PERRL    - III/IV/VI: EOMI without ptosis or diploplia.     - V: Facial sensation is grossly normal    - VII: Facial movement is symmetric.     - VIII: hearing is intact to voice    - X: Uvula elevates symmetrically    - XI: Shoulder shrug is symmetric.    - XII: tongue is midline without atrophy or fasciculations.  Sensory: Sensation grossly intact to LT Deep Tendon Reflexes    - 2+ and symmetric in the biceps and patellae.  Plantars   - Toes are downgoing bilaterally.  Cerebellar    - FNF and HKS are intact bilaterally No drift  Results - Imaging/Labs   Results for orders placed or performed during the hospital encounter of 07/16/18 (from the past 48 hour(s))  Basic metabolic panel     Status: Abnormal   Collection Time: 07/16/18 11:17 AM  Result Value Ref Range   Sodium 138 135 - 145 mmol/L   Potassium 4.1 3.5 - 5.1 mmol/L   Chloride  106 98 - 111 mmol/L   CO2 24 22 - 32 mmol/L   Glucose, Bld 114 (H) 70 - 99 mg/dL   BUN 17 8 - 23 mg/dL   Creatinine, Ser 1.04 0.61 - 1.24 mg/dL   Calcium 9.8 8.9 - 10.3 mg/dL   GFR calc non Af Amer >60 >60 mL/min   GFR calc Af Amer >60 >60 mL/min   Anion gap 8 5 - 15    Comment: Performed  at [facility], [place] [address], [place], [place] [address]  CBC with Differential     Status: None   Collection Time: 07/16/18 11:17 AM  Result Value Ref Range   WBC 7.7 4.0 - 10.5 K/uL   RBC 4.79 4.22 - 5.81 MIL/uL   Hemoglobin 14.5 13.0 - 17.0 g/dL   HCT 45.0 39.0 - 52.0 %   MCV 93.9 80.0 - 100.0 fL   MCH 30.3 26.0 - 34.0 pg   MCHC 32.2 30.0 - 36.0 g/dL   RDW 12.2 11.5 - 15.5 %   Platelets 315 150 - 400 K/uL   nRBC 0.0 0.0 - 0.2 %   Neutrophils Relative % 78 %   Neutro Abs 5.9 1.7 - 7.7 K/uL   Lymphocytes Relative 16 %   Lymphs Abs 1.2 0.7 - 4.0 K/uL   Monocytes Relative 5 %   Monocytes Absolute 0.4 0.1 - 1.0 K/uL   Eosinophils Relative 1 %   Eosinophils Absolute 0.1 0.0 - 0.5 K/uL   Basophils Relative 0 %   Basophils Absolute 0.0 0.0 - 0.1 K/uL   Immature Granulocytes 0 %   Abs Immature Granulocytes 0.03 0.00 - 0.07 K/uL    Comment: Performed at [facility], [address], [place], [place] [address]  Sedimentation rate     Status: Abnormal   Collection Time: 07/16/18 11:17 AM  Result Value Ref Range   Sed Rate 17 (H) 0 - 16 mm/hr    Comment: Performed at [place] [address] [place], [place] [address]  I-stat Chem 8, ED     Status: Abnormal   Collection Time: 07/16/18 11:28 AM  Result Value Ref Range   Sodium 138 135 - 145 mmol/L   Potassium 4.1 3.5 - 5.1 mmol/L   Chloride 104 98 - 111 mmol/L   BUN 21 8 - 23 mg/dL   Creatinine, Ser 1.00 0.61 - 1.24 mg/dL   Glucose, Bld 111 (H) 70 - 99 mg/dL   Calcium, Ion 1.16 1.15 - 1.40 mmol/L   TCO2 26 22 - 32 mmol/L   Hemoglobin 15.3 13.0 - 17.0 g/dL   HCT 45.0 39.0 - 52.0 %    Ct Angio Head W/cm &/or Wo  Cm  Result Date: 07/16/2018 CLINICAL DATA:  Severe occipital headache.  Carotid dissection. EXAM: CT ANGIOGRAPHY HEAD AND NECK TECHNIQUE: Multidetector CT imaging of the head and neck was performed using the standard protocol during bolus administration of intravenous contrast. Multiplanar CT image reconstructions and MIPs were obtained to evaluate the vascular anatomy. Carotid stenosis measurements (when applicable) are obtained utilizing NASCET criteria, using the distal internal carotid diameter as the denominator. CONTRAST:  35mL ISOVUE-370 IOPAMIDOL (ISOVUE-370) INJECTION 76% COMPARISON:  None. FINDINGS: CT HEAD FINDINGS Brain: Large subdural hematoma on the left measuring 23 mm in thickness. Mixed density subdural. Majority is low-density however there are areas of high density compatible with subacute subdural with multiple episodes of intermittent bleeding. 14 mm midline shift to the right. Negative for acute infarct.  Negative for mass or edema. Vascular: Negative for hyperdense vessel Skull: Negative for skull fracture or skull lesion. Sinuses: Negative Orbits: Negative Review of the MIP images confirms the above findings CTA NECK FINDINGS Aortic arch: Standard branching. Imaged portion shows no evidence of aneurysm or dissection. No significant stenosis of the major arch vessel origins. Right carotid system: Right carotid widely patent without stenosis or dissection. Small atherosclerotic plaque carotid bulb. Left carotid system: Left carotid  widely patent without stenosis or dissection. Minimal atherosclerotic disease at the bifurcation. Vertebral arteries: Left vertebral dominant. Both vertebral arteries patent to the basilar. Negative for vertebral dissection. Mild calcific stenosis origin of the vertebral artery on the right. Skeleton: No acute skeletal abnormality. Other neck: Negative Upper chest: Partially calcified pleural plaque right apex unchanged from prior studies. Review of the MIP images  confirms the above findings CTA HEAD FINDINGS Anterior circulation: Cavernous carotid widely patent bilaterally without stenosis. Anterior and middle cerebral arteries widely patent bilaterally without stenosis. Posterior circulation: Both vertebral arteries patent to the basilar without stenosis. PICA patent bilaterally. Basilar widely patent. Superior cerebellar and posterior cerebral arteries patent bilaterally without stenosis. Venous sinuses: Patent Anatomic variants: None Delayed phase: Normal enhancement postcontrast administration. Subdural hematoma does not show significant enhancement. Review of the MIP images confirms the above findings IMPRESSION: 1. Large left-sided subdural hematoma 23 mm in thickness with mixed density compatible with subacute blood. Probable multiple episodes of hemorrhage. 14 mm midline shift to the right 2. Negative for carotid dissection. No significant carotid or vertebral artery stenosis. 3. These results were called by telephone at the time of interpretation on 07/16/2018 at 1:37 pm to Dr. Aletta Edouard , who verbally acknowledged these results. Electronically Signed   By: Franchot Gallo M.D.   On: 07/16/2018 13:38   Ct Angio Neck W And/or Wo Contrast  Result Date: 07/16/2018 CLINICAL DATA:  Severe occipital headache.  Carotid dissection. EXAM: CT ANGIOGRAPHY HEAD AND NECK TECHNIQUE: Multidetector CT imaging of the head and neck was performed using the standard protocol during bolus administration of intravenous contrast. Multiplanar CT image reconstructions and MIPs were obtained to evaluate the vascular anatomy. Carotid stenosis measurements (when applicable) are obtained utilizing NASCET criteria, using the distal internal carotid diameter as the denominator. CONTRAST:  70mL ISOVUE-370 IOPAMIDOL (ISOVUE-370) INJECTION 76% COMPARISON:  None. FINDINGS: CT HEAD FINDINGS Brain: Large subdural hematoma on the left measuring 23 mm in thickness. Mixed density subdural.  Majority is low-density however there are areas of high density compatible with subacute subdural with multiple episodes of intermittent bleeding. 14 mm midline shift to the right. Negative for acute infarct.  Negative for mass or edema. Vascular: Negative for hyperdense vessel Skull: Negative for skull fracture or skull lesion. Sinuses: Negative Orbits: Negative Review of the MIP images confirms the above findings CTA NECK FINDINGS Aortic arch: Standard branching. Imaged portion shows no evidence of aneurysm or dissection. No significant stenosis of the major arch vessel origins. Right carotid system: Right carotid widely patent without stenosis or dissection. Small atherosclerotic plaque carotid bulb. Left carotid system: Left carotid widely patent without stenosis or dissection. Minimal atherosclerotic disease at the bifurcation. Vertebral arteries: Left vertebral dominant. Both vertebral arteries patent to the basilar. Negative for vertebral dissection. Mild calcific stenosis origin of the vertebral artery on the right. Skeleton: No acute skeletal abnormality. Other neck: Negative Upper chest: Partially calcified pleural plaque right apex unchanged from prior studies. Review of the MIP images confirms the above findings CTA HEAD FINDINGS Anterior circulation: Cavernous carotid widely patent bilaterally without stenosis. Anterior and middle cerebral arteries widely patent bilaterally without stenosis. Posterior circulation: Both vertebral arteries patent to the basilar without stenosis. PICA patent bilaterally. Basilar widely patent. Superior cerebellar and posterior cerebral arteries patent bilaterally without stenosis. Venous sinuses: Patent Anatomic variants: None Delayed phase: Normal enhancement postcontrast administration. Subdural hematoma does not show significant enhancement. Review of the MIP images confirms the above findings IMPRESSION: 1. Large left-sided subdural hematoma  23 mm in thickness with  mixed density compatible with subacute blood. Probable multiple episodes of hemorrhage. 14 mm midline shift to the right 2. Negative for carotid dissection. No significant carotid or vertebral artery stenosis. 3. These results were called by telephone at the time of interpretation on 07/16/2018 at 1:37 pm to Dr. Aletta Edouard , who verbally acknowledged these results. Electronically Signed   By: Franchot Gallo M.D.   On: 07/16/2018 13:38   Impression/Plan   67 y.o. 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 SDH measures roughly 53mm in max thickness and with 55mm MLS. Fortunately, he is neuro intact and only complains of headaches. He may have had some episodes of what sounds like expressive aphasia, ?seizure. Given size of SDH, he will need to undergo left sided craniotomy vs burr hole for evacuation.    I have extensively gone over the risks of the procedure, including acute reaccumulation of SDH, benefits and alternatives. Patient states in own language understanding of the procedure and wishes to proceed. Will proceed later today as he has been NPO since yesterday. We discussed post operative course at length and expectations for recovery.  Will admit to ICU postoperatively for monitoring. Started on Keppra 500mg  BID.

## 2018-07-16 NOTE — Op Note (Signed)
  NEUROSURGERY OPERATIVE NOTE   PREOP DIAGNOSIS:  1. Left chronic SDH   POSTOP DIAGNOSIS: Same  PROCEDURE: 1. Left craniotomy for evacuation of SDH  SURGEON: Dr. Consuella Lose, MD  ASSISTANT: Ferne Reus, PA-C  ANESTHESIA: General Endotracheal  EBL: 50cc  SPECIMENS: None  DRAINS: Subdural JP  COMPLICATIONS: None immediate  CONDITION: Hemodynamically stable to PACU  HISTORY: ZAMARIAN SCARANO is a 67 y.o. male initially presented to the emergency department with several days of progressively worsening headache.  Work-up included CT scan which demonstrated a large left convexity subacute on chronic subdural hematoma.  There was a significant amount of mass-effect.  Given the clinical and radiographic findings, surgical evacuation was indicated.  The risks and benefits of surgery were explained in detail with the patient.  After all questions were answered informed consent was obtained and witnessed.  PROCEDURE IN DETAIL: The patient was brought to the operating room. After induction of general anesthesia, the patient was positioned on the operative table in the supine position. All pressure points were meticulously padded. Skin incision was then marked out and prepped and draped in the usual sterile fashion.  After timeout was conducted, the incision was infiltrated with local anesthetic with epinephrine.  Sigmoid shaped incision was then made and carried through the galea.  Hemostasis on the skin edges was secured with Raney clips.  Periosteal was used to elevate the periosteum.  Self-retaining retractor was placed.  2 bur holes were then created and a craniotome was used to elevate a single piece craniotomy flap.  Hemostasis on the epidural surface was achieved with bipolar electrocautery.  The dural edges was packed with morselized Gelfoam with thrombin.    Bovie electrocautery was then used to open the dura in cruciate fashion.  It immediately underneath the dura we  identified a subacute/chronic subdural hematoma.  This was initially evacuated using suction.  Subdural membrane was then identified and coagulated.  The subdural membrane was then elevated off the peel surface.  The subdural membrane was also incised in a cruciate fashion.  At this point, the subdural space was irrigated with approximately 3 L of normal saline irrigation using a red rubber catheter to irrigate essentially the entire supratentorial subdural space.  This was done frontally, and towards the occipital pole until irrigation ran clear.  At this point, the subdural drain was placed and tunneled subcutaneously.  A large sheet of Gelfoam was then placed over the dural opening.  Bone flap was then replaced and plated with standard titanium plates and screws.  The galea was closed with interrupted 0 and 3-0 Vicryl stitches.  The drain was secured with 0 Vicryl stitch.  Skin was closed with skin staples.  Bacitracin ointment and sterile dressing was applied.  At the end of the case all sponge, needle, instrument, and cottonoid counts were correct.  The patient was then transferred to the stretcher, extubated, and taken to postanesthesia care unit in stable hemodynamic condition.

## 2018-07-17 ENCOUNTER — Inpatient Hospital Stay (HOSPITAL_COMMUNITY): Payer: Medicare Other

## 2018-07-17 ENCOUNTER — Encounter (HOSPITAL_COMMUNITY): Payer: Self-pay | Admitting: Neurosurgery

## 2018-07-17 LAB — BASIC METABOLIC PANEL
Anion gap: 7 (ref 5–15)
BUN: 20 mg/dL (ref 8–23)
CO2: 24 mmol/L (ref 22–32)
Calcium: 8.6 mg/dL — ABNORMAL LOW (ref 8.9–10.3)
Chloride: 107 mmol/L (ref 98–111)
Creatinine, Ser: 1.09 mg/dL (ref 0.61–1.24)
GFR calc Af Amer: >60 mL/min (ref >60)
GFR calc non Af Amer: >60 mL/min (ref >60)
Glucose, Bld: 147 mg/dL — ABNORMAL HIGH (ref 70–99)
Potassium: 4.3 mmol/L (ref 3.5–5.1)
Sodium: 138 mmol/L (ref 135–145)

## 2018-07-17 LAB — CBC
HCT: 35.6 % — ABNORMAL LOW (ref 39.0–52.0)
Hemoglobin: 11.6 g/dL — ABNORMAL LOW (ref 13.0–17.0)
MCH: 30.2 pg (ref 26.0–34.0)
MCHC: 32.6 g/dL (ref 30.0–36.0)
MCV: 92.7 fL (ref 80.0–100.0)
Platelets: 254 10*3/uL (ref 150–400)
RBC: 3.84 MIL/uL — ABNORMAL LOW (ref 4.22–5.81)
RDW: 12 % (ref 11.5–15.5)
WBC: 8 10*3/uL (ref 4.0–10.5)
nRBC: 0 % (ref 0.0–0.2)

## 2018-07-17 LAB — HIV ANTIBODY (ROUTINE TESTING W REFLEX): HIV Screen 4th Generation wRfx: NONREACTIVE

## 2018-07-17 MED ORDER — SODIUM CHLORIDE 0.9 % IV SOLN
INTRAVENOUS | Status: DC | PRN
  Administered 2018-07-17: 21:00:00 via INTRAVENOUS

## 2018-07-17 NOTE — Progress Notes (Signed)
  NEUROSURGERY PROGRESS NOTE   No issues overnight.  Has surgical site pain, but pre op HA and neck pain significant better Denies change in vision, focal deficit  EXAM:  BP 101/67   Pulse 97   Temp 98.8 F (37.1 C) (Oral)   Resp 11   Ht 5\' 8"  (1.727 m)   Wt 76.3 kg   SpO2 97%   BMI 25.58 kg/m   Awake, alert, oriented  Speech fluent, appropriate  CN grossly intact  5/5 BUE/BLE  Incision c/d/i Drain with 300cc output overnight  IMPRESSION/PLAN 68 y.o. male s/p craniotomy for evacuation of SDH. He is neurologically intact. HA much improved.  - Repeat head CT reviewed and shows improvement in overall SDH collection and MLS. - Continue drain for at least one more day. - Continue supportive care - PT/OT today

## 2018-07-18 NOTE — Evaluation (Signed)
Physical Therapy Evaluation Patient Details Name: Joseph Ford MRN: [id number] DOB: Nov 23, 1950 Today's Date: 07/18/2018   History of Present Illness  67 y.o. male initially presented to the emergency department with several days of progressively worsening headache.  Work-up included CT scan which demonstrated a large left convexity subacute on chronic subdural hematoma s/p evauation.  Clinical Impression  Patient seen for therapy assessment.  Mobilizing well with no noted focal deficits at this time. Educated patient on activity precautions for d/c home. No further acute PT needs. Will sign off.     Follow Up Recommendations No PT follow up    Equipment Recommendations  None recommended by PT    Recommendations for Other Services       Precautions / Restrictions        Mobility  Bed Mobility Overal bed mobility: Independent                Transfers Overall transfer level: Independent                  Ambulation/Gait Ambulation/Gait assistance: Independent Gait Distance (Feet): 310 Feet Assistive device: None Gait Pattern/deviations: WFL(Within Functional Limits)   Gait velocity interpretation: 1.31 - 2.62 ft/sec, indicative of limited community ambulator General Gait Details: steady with ambulation  Stairs Stairs: Yes Stairs assistance: Modified independent (Device/Increase time) Stair Management: One rail Right Number of Stairs: 9 General stair comments: No physical assist or difficulties  Wheelchair Mobility    Modified Rankin (Stroke Patients Only)       Balance Overall balance assessment: No apparent balance deficits (not formally assessed)                                           Pertinent Vitals/Pain      Home Living Family/patient expects to be discharged to:: Private residence Living Arrangements: Other relatives Available Help at Discharge: Family Type of Home: House Home Access: Stairs to enter Entrance  Stairs-Rails: Can reach both Entrance Stairs-Number of Steps: 5 Home Layout: One level        Prior Function Level of Independence: Independent               Hand Dominance   Dominant Hand: Right    Extremity/Trunk Assessment   Upper Extremity Assessment Upper Extremity Assessment: Overall WFL for tasks assessed    Lower Extremity Assessment Lower Extremity Assessment: Overall WFL for tasks assessed       Communication   Communication: No difficulties  Cognition Arousal/Alertness: Awake/alert Behavior During Therapy: WFL for tasks assessed/performed Overall Cognitive Status: Within Functional Limits for tasks assessed                                        General Comments      Exercises     Assessment/Plan    PT Assessment Patent does not need any further PT services  PT Problem List         PT Treatment Interventions      PT Goals (Current goals can be found in the Care Plan section)  Acute Rehab PT Goals PT Goal Formulation: All assessment and education complete, DC therapy    Frequency     Barriers to discharge        Co-evaluation  AM-PAC PT "6 Clicks" Mobility  Outcome Measure Help needed turning from your back to your side while in a flat bed without using bedrails?: None Help needed moving from lying on your back to sitting on the side of a flat bed without using bedrails?: None Help needed moving to and from a bed to a chair (including a wheelchair)?: None Help needed standing up from a chair using your arms (e.g., wheelchair or bedside chair)?: None Help needed to walk in hospital room?: None Help needed climbing 3-5 steps with a railing? : A Little 6 Click Score: 23    End of Session Equipment Utilized During Treatment: Gait belt Activity Tolerance: Patient tolerated treatment well Patient left: in bed;with call bell/phone within reach Nurse Communication: Mobility status PT Visit Diagnosis:  Other symptoms and signs involving the nervous system (R29.898)    Time: 4765-4650 PT Time Calculation (min) (ACUTE ONLY): 14 min   Charges:   PT Evaluation $PT Eval Low Complexity: [place], PT DPT  Board Certified Neurologic Specialist Acute Rehabilitation Services Pager [phone] Office [phone]   Duncan Dull 07/18/2018, 9:43 AM

## 2018-07-18 NOTE — Progress Notes (Signed)
Subjective: Patient reports patient doing well significant improvement preoperative headache  Objective: Vital signs in last 24 hours: Temp:  [98.2 F (36.8 C)-98.5 F (36.9 C)] 98.5 F (36.9 C) (11/28 0329) Pulse Rate:  [78-107] 97 (11/28 0800) Resp:  [11-22] 15 (11/28 0800) BP: (108-132)/(70-82) 119/78 (11/28 0800) SpO2:  [91 %-98 %] 92 % (11/28 0800)  Intake/Output from previous day: 11/27 0701 - 11/28 0700 In: 1443.4 [P.O.:960; I.V.:283.4; IV Piggyback:200] Out: 1060 [Urine:925; Drains:135] Intake/Output this shift: Total I/O In: 240 [P.O.:240] Out: -   patient is awake alert drain output minimal and mostly CSF at this point.  Lab Results: Recent Labs    07/16/18 1117 07/16/18 1128 07/17/18 0627  WBC 7.7  --  8.0  HGB 14.5 15.3 11.6*  HCT 45.0 45.0 35.6*  PLT 315  --  254   BMET Recent Labs    07/16/18 1117 07/16/18 1128 07/17/18 0627  NA 138 138 138  K 4.1 4.1 4.3  CL 106 104 107  CO2 24  --  24  GLUCOSE 114* 111* 147*  BUN 17 21 20   CREATININE 1.04 1.00 1.09  CALCIUM 9.8  --  8.6*    Studies/Results: Ct Angio Head W/cm &/or Wo Cm  Result Date: 07/16/2018 CLINICAL DATA:  Severe occipital headache.  Carotid dissection. EXAM: CT ANGIOGRAPHY HEAD AND NECK TECHNIQUE: Multidetector CT imaging of the head and neck was performed using the standard protocol during bolus administration of intravenous contrast. Multiplanar CT image reconstructions and MIPs were obtained to evaluate the vascular anatomy. Carotid stenosis measurements (when applicable) are obtained utilizing NASCET criteria, using the distal internal carotid diameter as the denominator. CONTRAST:  81mL ISOVUE-370 IOPAMIDOL (ISOVUE-370) INJECTION 76% COMPARISON:  None. FINDINGS: CT HEAD FINDINGS Brain: Large subdural hematoma on the left measuring 23 mm in thickness. Mixed density subdural. Majority is low-density however there are areas of high density compatible with subacute subdural with multiple  episodes of intermittent bleeding. 14 mm midline shift to the right. Negative for acute infarct.  Negative for mass or edema. Vascular: Negative for hyperdense vessel Skull: Negative for skull fracture or skull lesion. Sinuses: Negative Orbits: Negative Review of the MIP images confirms the above findings CTA NECK FINDINGS Aortic arch: Standard branching. Imaged portion shows no evidence of aneurysm or dissection. No significant stenosis of the major arch vessel origins. Right carotid system: Right carotid widely patent without stenosis or dissection. Small atherosclerotic plaque carotid bulb. Left carotid system: Left carotid widely patent without stenosis or dissection. Minimal atherosclerotic disease at the bifurcation. Vertebral arteries: Left vertebral dominant. Both vertebral arteries patent to the basilar. Negative for vertebral dissection. Mild calcific stenosis origin of the vertebral artery on the right. Skeleton: No acute skeletal abnormality. Other neck: Negative Upper chest: Partially calcified pleural plaque right apex unchanged from prior studies. Review of the MIP images confirms the above findings CTA HEAD FINDINGS Anterior circulation: Cavernous carotid widely patent bilaterally without stenosis. Anterior and middle cerebral arteries widely patent bilaterally without stenosis. Posterior circulation: Both vertebral arteries patent to the basilar without stenosis. PICA patent bilaterally. Basilar widely patent. Superior cerebellar and posterior cerebral arteries patent bilaterally without stenosis. Venous sinuses: Patent Anatomic variants: None Delayed phase: Normal enhancement postcontrast administration. Subdural hematoma does not show significant enhancement. Review of the MIP images confirms the above findings IMPRESSION: 1. Large left-sided subdural hematoma 23 mm in thickness with mixed density compatible with subacute blood. Probable multiple episodes of hemorrhage. 14 mm midline shift to the  right 2.  Negative for carotid dissection. No significant carotid or vertebral artery stenosis. 3. These results were called by telephone at the time of interpretation on 07/16/2018 at 1:37 pm to Dr. Aletta Edouard , who verbally acknowledged these results. Electronically Signed   By: Franchot Gallo M.D.   On: 07/16/2018 13:38   Ct Head Wo Contrast  Result Date: 07/17/2018 CLINICAL DATA:  Subdural hematoma follow-up EXAM: CT HEAD WITHOUT CONTRAST TECHNIQUE: Contiguous axial images were obtained from the base of the skull through the vertex without intravenous contrast. COMPARISON:  Head CT 07/16/2018 FINDINGS: Brain: The patient has undergone left frontal craniotomy with placement of a subdural drainage catheter. The left convexity hematoma has markedly decreased in size, now measuring 14 mm on coronal imaging, compared to 23 mm previously. Midline shift has greatly improved and now measures 5 mm at the level of the foramina of [place], previously 14 mm. There is a small amount of pneumocephalus. Vascular: No abnormal hyperdensity of the major intracranial arteries or dural venous sinuses. No intracranial atherosclerosis. Skull: Status post left frontal craniotomy. Sinuses/Orbits: No fluid levels or advanced mucosal thickening of the visualized paranasal sinuses. No mastoid or middle ear effusion. The orbits are normal. IMPRESSION: 1. Status post left frontal craniotomy with placement of subdural drainage catheter with decreased size of subdural hematoma, now measuring 14 mm. 2. Reduced midline shift, now 5 mm. Electronically Signed   By: Ulyses Jarred M.D.   On: 07/17/2018 05:36   Ct Angio Neck W And/or Wo Contrast  Result Date: 07/16/2018 CLINICAL DATA:  Severe occipital headache.  Carotid dissection. EXAM: CT ANGIOGRAPHY HEAD AND NECK TECHNIQUE: Multidetector CT imaging of the head and neck was performed using the standard protocol during bolus administration of intravenous contrast. Multiplanar CT image  reconstructions and MIPs were obtained to evaluate the vascular anatomy. Carotid stenosis measurements (when applicable) are obtained utilizing NASCET criteria, using the distal internal carotid diameter as the denominator. CONTRAST:  67mL ISOVUE-370 IOPAMIDOL (ISOVUE-370) INJECTION 76% COMPARISON:  None. FINDINGS: CT HEAD FINDINGS Brain: Large subdural hematoma on the left measuring 23 mm in thickness. Mixed density subdural. Majority is low-density however there are areas of high density compatible with subacute subdural with multiple episodes of intermittent bleeding. 14 mm midline shift to the right. Negative for acute infarct.  Negative for mass or edema. Vascular: Negative for hyperdense vessel Skull: Negative for skull fracture or skull lesion. Sinuses: Negative Orbits: Negative Review of the MIP images confirms the above findings CTA NECK FINDINGS Aortic arch: Standard branching. Imaged portion shows no evidence of aneurysm or dissection. No significant stenosis of the major arch vessel origins. Right carotid system: Right carotid widely patent without stenosis or dissection. Small atherosclerotic plaque carotid bulb. Left carotid system: Left carotid widely patent without stenosis or dissection. Minimal atherosclerotic disease at the bifurcation. Vertebral arteries: Left vertebral dominant. Both vertebral arteries patent to the basilar. Negative for vertebral dissection. Mild calcific stenosis origin of the vertebral artery on the right. Skeleton: No acute skeletal abnormality. Other neck: Negative Upper chest: Partially calcified pleural plaque right apex unchanged from prior studies. Review of the MIP images confirms the above findings CTA HEAD FINDINGS Anterior circulation: Cavernous carotid widely patent bilaterally without stenosis. Anterior and middle cerebral arteries widely patent bilaterally without stenosis. Posterior circulation: Both vertebral arteries patent to the basilar without stenosis.  PICA patent bilaterally. Basilar widely patent. Superior cerebellar and posterior cerebral arteries patent bilaterally without stenosis. Venous sinuses: Patent Anatomic variants: None Delayed phase: Normal enhancement postcontrast  administration. Subdural hematoma does not show significant enhancement. Review of the MIP images confirms the above findings IMPRESSION: 1. Large left-sided subdural hematoma 23 mm in thickness with mixed density compatible with subacute blood. Probable multiple episodes of hemorrhage. 14 mm midline shift to the right 2. Negative for carotid dissection. No significant carotid or vertebral artery stenosis. 3. These results were called by telephone at the time of interpretation on 07/16/2018 at 1:37 pm to Dr. Aletta Edouard , who verbally acknowledged these results. Electronically Signed   By: Franchot Gallo M.D.   On: 07/16/2018 13:38    Assessment/Plan: DC'd subdural drain mobilized today possible discharge tomorrow  LOS: 2 days     Eriq Hufford P 07/18/2018, 8:36 AM

## 2018-07-18 NOTE — Evaluation (Signed)
Occupational Therapy Evaluation Patient Details Name: Joseph Ford MRN: [id number] DOB: 28-Jun-1951 Today's Date: 07/18/2018    History of Present Illness  67 y.o. male initially presented to the emergency department with several days of progressively worsening headache.  Work-up included CT of head showed Large Lt sided SDH with 14 mm midline shift to the Rt.  Pt underwent craniotomy with evacuation.    Clinical Impression   Pt admitted with above. He demonstrates the below listed deficits and will benefit from continued OT to maximize safety and independence with BADLs.  Pt presents to OT with impaired cognition.  The MOCA-B was administered and he scored a 23/30 with deficits noted in executive functioning, fluency, and memory.  Upon further questioning, pt reports he has noted cognitive changes at home, and was having difficulty "doing two things at once".   He is able to perform ADLs independently, but will require supervision - min A for IADLs.   He lives with his sister, is retired from [employer], and was fully independent PTA.  Recommend OPOT and SLP to address cognitive deficits and IADLs.        Follow Up Recommendations  Outpatient OT; and OP SLP for cognitive deficits and IADLs    Equipment Recommendations       Recommendations for Other Services       Precautions / Restrictions Precautions Precautions: None      Mobility Bed Mobility Overal bed mobility: Independent                Transfers Overall transfer level: Independent                    Balance Overall balance assessment: No apparent balance deficits (not formally assessed)                                         ADL either performed or assessed with clinical judgement   ADL Overall ADL's : Modified independent                                             Vision Baseline Vision/History: Wears glasses Wears Glasses: At all times Patient  Visual Report: No change from baseline Vision Assessment?: Yes Eye Alignment: Within Functional Limits Ocular Range of Motion: Within Functional Limits Alignment/Gaze Preference: Within Defined Limits Tracking/Visual Pursuits: Able to track stimulus in all quads without difficulty Saccades: Within functional limits Convergence: Impaired (comment) Visual Fields: No apparent deficits Additional Comments: able to maintain convergence ~14" from nose which is outside the norms      Perception Perception Perception Tested?: Yes   Praxis Praxis Praxis tested?: Within functional limits    Pertinent Vitals/Pain       Hand Dominance Right   Extremity/Trunk Assessment Upper Extremity Assessment Upper Extremity Assessment: Overall WFL for tasks assessed   Lower Extremity Assessment Lower Extremity Assessment: Overall WFL for tasks assessed       Communication Communication Communication: No difficulties   Cognition Arousal/Alertness: Awake/alert Behavior During Therapy: WFL for tasks assessed/performed Overall Cognitive Status: Impaired/Different from baseline Area of Impairment: Memory                     Memory: Decreased short-term memory  General Comments: Pt scored 23/30 on the [place] - B.  He demonstrates deficits with executive functioning, memory (only able to recall 2/5 words, and felt this would be an easy task for him due to his prior employment), and fluency (only able to name 6 fruits in 60 seconds).  Results of MOCA were discussed with pt and he agrees with deficit areas, and upon further conversation, he revealed he has been having noticeable changes with his cognition PTA    General Comments  Pt reports his sister or a friend can provide transport to OP center     Exercises     Shoulder Instructions      Home Living Family/patient expects to be discharged to:: Private residence Living Arrangements: Other relatives Available Help at Discharge:  Family Type of Home: House Home Access: Stairs to enter Technical brewer of Steps: 5 Entrance Stairs-Rails: Can reach both Home Layout: One level     Bathroom Shower/Tub: Teacher, early years/pre: Standard                Prior Functioning/Environment Level of Independence: Independent        Comments: Pt is retired post [employer].  He reports he has had difficulty multi tasking and has pared down his activities over the past couple of months due to pain, and what he feels were mild cognitive changes         OT Problem List: Decreased cognition      OT Treatment/Interventions: Self-care/ADL training;Therapeutic activities;Cognitive remediation/compensation;Patient/family education    OT Goals(Current goals can be found in the care plan section) Acute Rehab OT Goals Patient Stated Goal: "I'm hoping to go home tomorrow and sleep in my own bed"  OT Goal Formulation: With patient Time For Goal Achievement: 07/25/18 Potential to Achieve Goals: Good ADL Goals Additional ADL Goal #1: Pt will perform moderately complex path finding activity with no more than min cues Additional ADL Goal #2: Pt will demonstrate alternating and divided attention with min cues during novel tasks  OT Frequency: Min 2X/week   Barriers to D/C:            Co-evaluation              AM-PAC OT "6 Clicks" Daily Activity     Outcome Measure Help from another person eating meals?: None Help from another person taking care of personal grooming?: None Help from another person toileting, which includes using toliet, bedpan, or urinal?: None Help from another person bathing (including washing, rinsing, drying)?: None Help from another person to put on and taking off regular upper body clothing?: None Help from another person to put on and taking off regular lower body clothing?: None 6 Click Score: 24   End of Session    Activity Tolerance: Patient tolerated treatment  well Patient left: in chair;with call bell/phone within reach  OT Visit Diagnosis: Cognitive communication deficit (J82.505)                Time: 3976-7341 OT Time Calculation (min): 40 min Charges:  OT General Charges $OT Visit: 1 Visit OT Evaluation $OT Eval Moderate Complexity: 1 Mod OT Treatments $Therapeutic Activity: 23-37 mins  Lucille Passy, OTR/L Acute Rehabilitation Services Pager [phone] Office [phone]   Lucille Passy M 07/18/2018, 5:15 PM

## 2018-07-19 ENCOUNTER — Other Ambulatory Visit: Payer: Self-pay

## 2018-07-19 MED ORDER — HYDROCODONE-ACETAMINOPHEN 5-325 MG PO TABS: 1.0000 | ORAL_TABLET | ORAL | 0 refills | Status: DC | PRN

## 2018-07-19 MED FILL — Thrombin (Recombinant) For Soln 20000 Unit: CUTANEOUS | Qty: 1 | Status: AC

## 2018-07-19 NOTE — Progress Notes (Signed)
Dressing and staples holding dressing to left head removed, site approximated with dried blood, no active bleeding. Drain site covered with band-aid. Discharge instructions and prescription given to patient and questions answered.

## 2018-07-19 NOTE — Discharge Summary (Signed)
  Physician Discharge Summary  Patient ID: Joseph Ford MRN: [id number] DOB/AGE: May 01, 1951 67 y.o. Estimated body mass index is 25.58 kg/m as calculated from the following:   Height as of this encounter: 5\' 8"  (1.727 m).   Weight as of this encounter: 76.3 kg.   Admit date: 07/16/2018 Discharge date: 07/19/2018  Admission Diagnoses:subdural hematoma  Discharge Diagnoses: same Active Problems:   Subdural hematoma [facility])   Discharged Condition: good  Hospital Course: patient is admitted to the hospital through the emergency room diagnosed with a septated subacute subdural hematoma. Patient on craniotomy and postoperatively did very well with recovered on the floor had a subdural drain taken out on postoperative day 2 in stable for discharge posterior day 3. She'll be discharged scheduled follow-up in one to 2 weeks with Dr. Kathyrn Sheriff  Consults: Significant Diagnostic Studies: Treatments:craniotomy Discharge Exam: Blood pressure 118/78, pulse 89, temperature 98.3 F (36.8 C), temperature source Oral, resp. rate 13, height 5\' 8"  (1.727 m), weight 76.3 kg, SpO2 95 %. Awake alert neurologically nonfocal  Disposition: home   Allergies as of 07/19/2018   No Known Allergies     Medication List    TAKE these medications   atorvastatin 20 MG tablet Commonly known as:  LIPITOR Take 1 tablet (20 mg total) by mouth daily.   HYDROcodone-acetaminophen 5-325 MG tablet Commonly known as:  NORCO/VICODIN Take 1 tablet by mouth every 4 (four) hours as needed for moderate pain.   levothyroxine 125 MCG tablet Commonly known as:  SYNTHROID, LEVOTHROID TAKE ONE TABLET BY MOUTH ONCE DAILY        Signed: Kyrin Gratz P 07/19/2018, 7:40 AM

## 2018-07-19 NOTE — Plan of Care (Signed)
Patient education done.

## 2018-07-19 NOTE — Progress Notes (Signed)
Occupational Therapy Treatment Patient Details Name: Joseph Ford MRN: [id number] DOB: Sep 12, 1950 Today's Date: 07/19/2018    History of present illness  67 y.o. male initially presented to the emergency department with several days of progressively worsening headache.  Work-up included CT of head showed Large Lt sided SDH with 14 mm midline shift to the Rt.  Pt underwent craniotomy with evacuation.    OT comments  Pt preparing for discharge, sister present.  Reviewed deficits with sister, who acknowledges pt has demonstrated changes with cognition PTA.  Both pt and sister are aware to discuss need for OPOT and SLP if he doesn't return to baseline at follow up with Dr Diamantina Monks.   Recommend pt does not drive until cleared by MD at follow up.  Sister will supervise pt with medication management and finances.     Follow Up Recommendations  Outpatient OT;Other (comment)(OP SLP )    Equipment Recommendations       Recommendations for Other Services      Precautions / Restrictions Precautions Precautions: None       Mobility Bed Mobility                  Transfers                      Balance                                           ADL either performed or assessed with clinical judgement   ADL                                               Vision       Perception     Praxis      Cognition Arousal/Alertness: Awake/alert Behavior During Therapy: WFL for tasks assessed/performed Overall Cognitive Status: Impaired/Different from baseline Area of Impairment: Memory                               General Comments: Pt able to recall me, and what we did yesterday.         Exercises     Shoulder Instructions       General Comments Pt preparing for discharge, sister present.  He has been trying to remember the 5 words given yesterday during [place], and able to recall 2/5.  Discussed recommendation  for OPOT with he and sister, and they will discuss with Dr Diamantina Monks at follow up if deficits persist.  Pt gave permission for me to discuss eval findings with his sister.  She indicates that pt has had some cognitive changes, and had modified activities at home PTA.  She reports she will supervise him with medication management,  and finances, and she will drive until he is cleared by MD for return to driving.  Pt agreeable to all     Pertinent Vitals/ Pain       Pain Assessment: No/denies pain  Home Living  Prior Functioning/Environment              Frequency  Min 2X/week        Progress Toward Goals  OT Goals(current goals can now be found in the care plan section)  Progress towards OT goals: Progressing toward goals     Plan Discharge plan remains appropriate    Co-evaluation                 AM-PAC OT "6 Clicks" Daily Activity     Outcome Measure   Help from another person eating meals?: None Help from another person taking care of personal grooming?: None Help from another person toileting, which includes using toliet, bedpan, or urinal?: None Help from another person bathing (including washing, rinsing, drying)?: None Help from another person to put on and taking off regular upper body clothing?: None Help from another person to put on and taking off regular lower body clothing?: None 6 Click Score: 24    End of Session    OT Visit Diagnosis: Cognitive communication deficit (R41.841)   Activity Tolerance Patient tolerated treatment well   Patient Left Other (comment)(ambulating in room )   Nurse Communication          Time: 7943-2761 OT Time Calculation (min): 10 min  Charges: OT General Charges $OT Visit: 1 Visit OT Treatments $Therapeutic Activity: 8-22 mins  Lucille Passy, OTR/L [place] Pager [phone] Office [phone]    Lucille Passy  M 07/19/2018, 12:29 PM

## 2018-07-20 ENCOUNTER — Other Ambulatory Visit: Payer: Self-pay | Admitting: Family Medicine

## 2018-07-22 NOTE — Anesthesia Postprocedure Evaluation (Signed)
Anesthesia Post Note  Patient: Joseph Ford  Procedure(s) Performed: CRANIOTOMY HEMATOMA EVACUATION SUBDURAL (Left Head)     Patient location during evaluation: PACU Anesthesia Type: General Level of consciousness: awake and patient cooperative Pain management: pain level controlled Vital Signs Assessment: post-procedure vital signs reviewed and stable Respiratory status: spontaneous breathing, nonlabored ventilation, respiratory function stable and patient connected to nasal cannula oxygen Cardiovascular status: blood pressure returned to baseline and stable Postop Assessment: no apparent nausea or vomiting Anesthetic complications: no    Last Vitals:  Vitals:   07/19/18 0700 07/19/18 0800  BP: 113/73 131/83  Pulse: 84 (!) 102  Resp: 13 13  Temp:  37.2 C  SpO2: 95% 95%    Last Pain:  Vitals:   07/19/18 0800  TempSrc: Axillary  PainSc: 2                  Nigil Braman

## 2018-07-29 ENCOUNTER — Ambulatory Visit
Admission: RE | Admit: 2018-07-29 | Discharge: 2018-07-29 | Disposition: A | Discharge status: Home / Self Care | Payer: No Typology Code available for payment source | Source: Ambulatory Visit | Attending: Neurosurgery | Admitting: Neurosurgery

## 2018-07-29 ENCOUNTER — Other Ambulatory Visit: Payer: Self-pay | Admitting: Neurosurgery

## 2018-07-29 DIAGNOSIS — S065X9A Traumatic subdural hemorrhage with loss of consciousness of unspecified duration, initial encounter: Secondary | ICD-10-CM

## 2018-07-29 DIAGNOSIS — S065XAA Traumatic subdural hemorrhage with loss of consciousness status unknown, initial encounter: Secondary | ICD-10-CM

## 2018-08-05 ENCOUNTER — Ambulatory Visit (INDEPENDENT_AMBULATORY_CARE_PROVIDER_SITE_OTHER): Payer: No Typology Code available for payment source | Admitting: Family Medicine

## 2018-08-05 ENCOUNTER — Encounter: Payer: Self-pay | Admitting: Family Medicine

## 2018-08-05 VITALS — BP 118/78 | HR 78 | Temp 98.1°F | Ht 68.0 in | Wt 168.2 lb

## 2018-08-05 DIAGNOSIS — E039 Hypothyroidism, unspecified: Secondary | ICD-10-CM

## 2018-08-05 DIAGNOSIS — E785 Hyperlipidemia, unspecified: Secondary | ICD-10-CM | POA: Diagnosis not present

## 2018-08-05 DIAGNOSIS — Z125 Encounter for screening for malignant neoplasm of prostate: Secondary | ICD-10-CM

## 2018-08-05 DIAGNOSIS — Z0000 Encounter for general adult medical examination without abnormal findings: Secondary | ICD-10-CM

## 2018-08-05 DIAGNOSIS — I700 Atherosclerosis of aorta: Secondary | ICD-10-CM

## 2018-08-05 DIAGNOSIS — Z87891 Personal history of nicotine dependence: Secondary | ICD-10-CM

## 2018-08-05 DIAGNOSIS — S065XAA Traumatic subdural hemorrhage with loss of consciousness status unknown, initial encounter: Secondary | ICD-10-CM

## 2018-08-05 DIAGNOSIS — J439 Emphysema, unspecified: Secondary | ICD-10-CM

## 2018-08-05 DIAGNOSIS — S065X9A Traumatic subdural hemorrhage with loss of consciousness of unspecified duration, initial encounter: Secondary | ICD-10-CM

## 2018-08-05 NOTE — Assessment & Plan Note (Addendum)
Patient recently treated for septated subdural hematoma.  Patient treated with craniotomy and did well postoperatively.  Follow-up with Dr. Kathyrn Sheriff in 2 weeks- saw them on the 9th and had reassuring visit- then went to [facility] and had repeat CT scan- will have another visit set up soon- he is calling today  Some mild issues with speech and numbness in right hand- both of those are improving. Not working with speech right now but feels much improved

## 2018-08-05 NOTE — Addendum Note (Signed)
Addended by: Ezequiel Kayser T on: 08/05/2018 10:28 AM   Modules accepted: Orders

## 2018-08-05 NOTE — Addendum Note (Signed)
Addended by: Kayren Eaves T on: 08/05/2018 10:34 AM   Modules accepted: Orders

## 2018-08-05 NOTE — Patient Instructions (Addendum)
Please stop by lab before you go  We will call you within two weeks about your referral to aneurysm screening. If you do not hear within 3 weeks, give [place] a call.   You look incredible considering what you just went through!   Angela Nevin Christmas to you!

## 2018-08-05 NOTE — Addendum Note (Signed)
Addended by: Ezequiel Kayser T on: 08/05/2018 10:23 AM   Modules accepted: Orders

## 2018-08-05 NOTE — Addendum Note (Signed)
Addended by: Ezequiel Kayser T on: 08/05/2018 10:43 AM   Modules accepted: Orders

## 2018-08-05 NOTE — Assessment & Plan Note (Signed)
Per CT scan.  No significant symptoms

## 2018-08-05 NOTE — Progress Notes (Signed)
Phone: [phone]  Subjective:  Patient presents today for their annual physical. Chief complaint-noted.   See problem oriented charting- ROS- full  review of systems was completed and negative except for: some mild issues with speech, right hand numbness has resolve, mild headache last Monday- none since that time  The following were reviewed and entered/updated in epic: Past Medical History:  Diagnosis Date  . Hyperlipidemia   . Hypothyroidism    Patient Active Problem List   Diagnosis Date Noted  . Atypical chest pain suspect costochondritis but risk factors concerning 12/31/2014    Priority: High  . Former smoker 09/17/2006    Priority: High  . Hypothyroidism 12/31/2014    Priority: Medium  . Dyslipidemia 09/17/2006    Priority: Medium  . Subdural hematoma ([place]) 07/16/2018  . Aortic atherosclerosis ([place]) 07/11/2017  . History of squamous cell carcinoma of skin 10/18/2016   Past Surgical History:  Procedure Laterality Date  . CRANIOTOMY Left 07/16/2018   Procedure: CRANIOTOMY HEMATOMA EVACUATION SUBDURAL;  Surgeon: Consuella Lose, MD;  Location: [facility];  Service: Neurosurgery;  Laterality: Left;  . left eye surgery  1959   at [facility]    Family History  Problem Relation Age of Onset  . Hypertension Mother   . Heart disease Mother 81       died of heart attack  . Heart attack Father   . Heart disease Father 56       "mild heart attack"  . Cancer Sister        breast  . Breast cancer Sister 55  . Diabetes Maternal Aunt   . Diabetes Maternal Grandmother   . Thyroid disease Sister        unremoved, unclear reason    Medications- reviewed and updated Current Outpatient Medications  Medication Sig Dispense Refill  . atorvastatin (LIPITOR) 20 MG tablet TAKE 1 TABLET BY MOUTH DAILY 90 tablet 3  . levothyroxine (SYNTHROID, LEVOTHROID) 125 MCG tablet TAKE 1 TABLET BY MOUTH ONCE DAILY 90 tablet 3   No current facility-administered medications for  this visit.     Allergies-reviewed and updated No Known Allergies  Social History   Social History Narrative   Divorced. No children.    Lives with sister and husband who live in his basement. Has another sister that lives with him in his house.       Retired from post office.       Hobbies: yardwork, Architect work             Objective: BP 118/78 (BP Location: Left Arm, Patient Position: Sitting, Cuff Size: Large)   Pulse 78   Temp 98.1 F (36.7 C) (Oral)   Ht 5\' 8"  (1.727 m)   Wt 168 lb 3.2 oz (76.3 kg)   SpO2 96%   BMI 25.57 kg/m  Gen: NAD, resting comfortably HEENT: Mucous membranes are moist. Oropharynx normal Neck: no thyromegaly CV: RRR no murmurs rubs or gallops Lungs: CTAB no crackles, wheeze, rhonchi Abdomen: soft/nontender/nondistended/normal bowel sounds. No rebound or guarding.  Ext: no edema Skin: warm, dry, left scalp scar from craniotomy Neuro: grossly normal, moves all extremities, PERRLA  Assessment/Plan:  67 y.o. male presenting for annual physical.  Health Maintenance counseling: 1. Anticipatory guidance: Patient counseled regarding regular dental exams -q6 months, eye exams -yearly,  avoiding smoking and second hand smoke (some from brother), limiting alcohol to 2 beverages per day - very rare.   2. Risk factor reduction:  Advised patient of need for regular  exercise and diet rich and fruits and vegetables to reduce risk of heart attack and stroke. Exercise- exercise limited post surgery- will start back up once released. Diet-states eats what he wants- does still get some balance including veggies, proteins, fruit.  Wt Readings from Last 3 Encounters:  08/05/18 168 lb 3.2 oz (76.3 kg)  07/16/18 168 lb 3.4 oz (76.3 kg)  07/12/18 173 lb 9.6 oz (78.7 kg)  3. Immunizations/screenings/ancillary studies-consider Shingrix in the future-holding off with recent surgery. Immunization History  Administered Date(s) Administered  . Influenza, High  Dose Seasonal PF 05/07/2018  . Influenza,inj,Quad PF,6+ Mos 05/11/2014, 06/16/2016, 07/11/2017  . Influenza,inj,quad, With Preservative 06/11/2015  . Pneumococcal Conjugate-13 05/07/2018  . Pneumococcal Polysaccharide-23 04/04/2013  . Td 09/17/2006  . Tdap 07/11/2017  . Zoster 04/04/2013  4. Prostate cancer screening-low risk prior PSA trend-update PSA alone today.  If PSA trends up-future rectal exam planned. Lab Results  Component Value Date   PSA 0.8 07/11/2017   PSA 1.0 06/06/2016   PSA 1.23 06/04/2015   5. Colon cancer screening -opted for Cologuard in 2018-3-year repeat planned  6. Skin cancer screening-follows with skin surgery center due to history of skin cancer x 2. advised regular sunscreen use. Denies worrisome, changing, or new skin lesions.  7.  Former smoker- quit January 2019 with 33.6 pack years.  Will refer for AAA scan.  He is already in the lung cancer screening program.  Emphysema findings on prior CT scan.  Status of chronic or acute concerns   Hyperlipidemia- hopefully controlled on atorvastatin 20 mg-update lipids Aortic atherosclerosis-at least want LDL under 100 if not lower.  Hypothyroidism- compliant with levothyroxine 125 mcg-update TSH today  Emphysema of lung ([place]) Per CT scan.  No significant symptoms  No future appointments. No follow-ups on file.  Lab/Order associations: FASTING  No diagnosis found.  No orders of the defined types were placed in this encounter.   Return precautions advised.  Garret Reddish, MD

## 2018-08-06 LAB — URINALYSIS
Bilirubin Urine: NEGATIVE
Glucose, UA: NEGATIVE
Hgb urine dipstick: NEGATIVE
Ketones, ur: NEGATIVE
Leukocytes, UA: NEGATIVE
Nitrite: NEGATIVE
Protein, ur: NEGATIVE
Specific Gravity, Urine: 1.009 (ref 1.001–1.03)

## 2018-08-06 LAB — CBC
HEMATOCRIT: 40.7 % (ref 38.5–50.0)
Hemoglobin: 14.2 g/dL (ref 13.2–17.1)
MCH: 31.7 pg (ref 27.0–33.0)
MCHC: 34.9 g/dL (ref 32.0–36.0)
MCV: 90.8 fL (ref 80.0–100.0)
MPV: 9.6 fL (ref 7.5–12.5)
Platelets: 381 10*3/uL (ref 140–400)
RBC: 4.48 10*6/uL (ref 4.20–5.80)
RDW: 12.3 % (ref 11.0–15.0)
WBC: 5.6 10*3/uL (ref 3.8–10.8)

## 2018-08-06 LAB — COMPREHENSIVE METABOLIC PANEL
AG Ratio: 1.5 (calc) (ref 1.0–2.5)
ALT: 19 U/L (ref 9–46)
AST: 14 U/L (ref 10–35)
Albumin: 4.4 g/dL (ref 3.6–5.1)
Alkaline phosphatase (APISO): 53 U/L (ref 40–115)
BUN: 17 mg/dL (ref 7–25)
CO2: 24 mmol/L (ref 20–32)
Calcium: 9.9 mg/dL (ref 8.6–10.3)
Chloride: 106 mmol/L (ref 98–110)
Creat: 1 mg/dL (ref 0.70–1.25)
GLUCOSE: 104 mg/dL — AB (ref 65–99)
Globulin: 3 g/dL (calc) (ref 1.9–3.7)
Potassium: 4.4 mmol/L (ref 3.5–5.3)
SODIUM: 144 mmol/L (ref 135–146)
Total Bilirubin: 0.3 mg/dL (ref 0.2–1.2)
Total Protein: 7.4 g/dL (ref 6.1–8.1)

## 2018-08-06 LAB — LIPID PANEL
CHOL/HDL RATIO: 3.5 (calc) (ref <5.0)
Cholesterol: 146 mg/dL (ref <200)
HDL: 42 mg/dL (ref >40)
LDL Cholesterol (Calc): 82 mg/dL (calc)
Non-HDL Cholesterol (Calc): 104 mg/dL (calc) (ref <130)
Triglycerides: 122 mg/dL (ref <150)

## 2018-08-06 LAB — PSA: PSA: 1.4 ng/mL (ref <=4.0)

## 2018-08-06 LAB — TSH: TSH: 1.58 mIU/L (ref 0.40–4.50)

## 2018-08-08 ENCOUNTER — Ambulatory Visit (HOSPITAL_COMMUNITY)
Admission: RE | Admit: 2018-08-08 | Discharge: 2018-08-08 | Disposition: A | Discharge status: Home / Self Care | Payer: No Typology Code available for payment source | Source: Ambulatory Visit | Attending: Cardiology | Admitting: Cardiology

## 2018-08-08 DIAGNOSIS — Z87891 Personal history of nicotine dependence: Secondary | ICD-10-CM | POA: Insufficient documentation

## 2018-08-08 DIAGNOSIS — Z0000 Encounter for general adult medical examination without abnormal findings: Secondary | ICD-10-CM | POA: Insufficient documentation

## 2018-08-19 IMAGING — CT CT CHEST LUNG CANCER SCREENING LOW DOSE W/O CM
2 of 4 series · 15 of 40 positions shown, 18 images · non-contrast
Comparison: None.

CLINICAL DATA: Current smoker, 43 pack-year history, lung cancer
screening.

EXAM:
CT CHEST WITHOUT CONTRAST LOW-DOSE FOR LUNG CANCER SCREENING
TECHNIQUE: Multidetector CT imaging of the chest was performed following the
standard protocol without IV contrast.

[Series 2: thorax 5.0 i31f 3 · axial · 0.73mm/px · z∈[-356,-66]mm · 12 of 70 slices shown, 15 images]
[im 6/70  mediastinal]
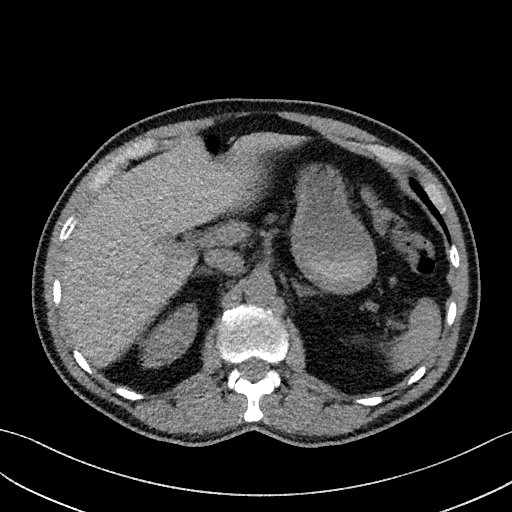
[im 6/70  lung]
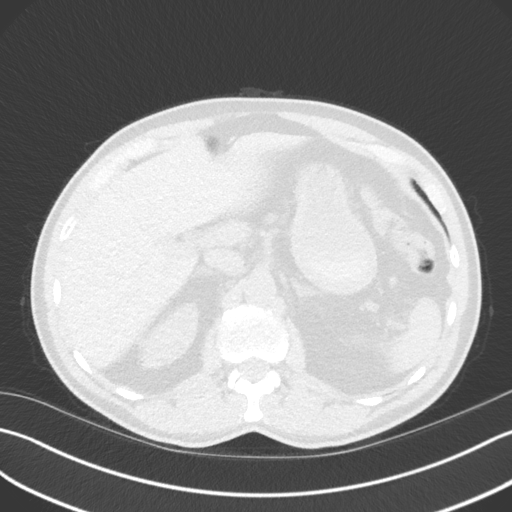
[im 11/70  lung]
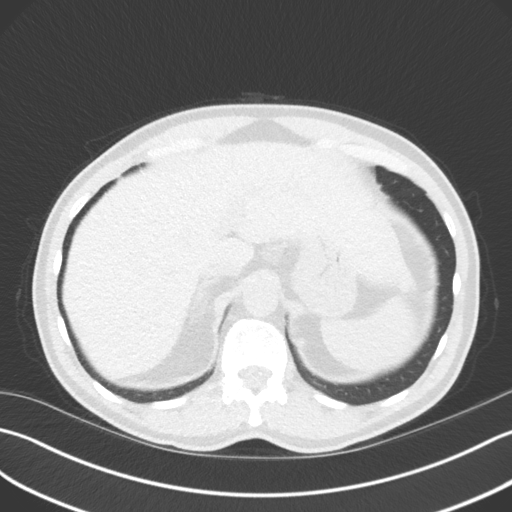
[im 16/70  lung]
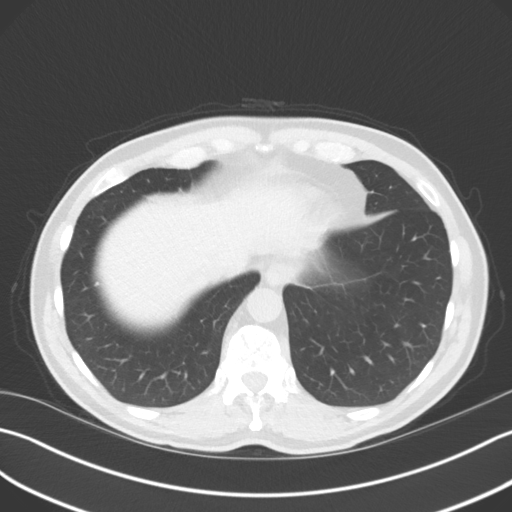
[im 22/70  lung]
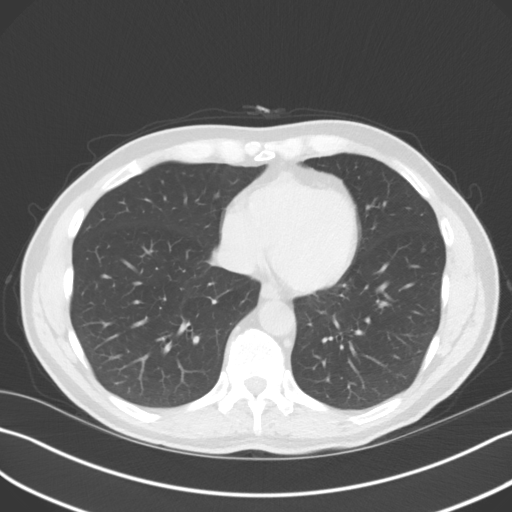
[im 27/70  mediastinal]
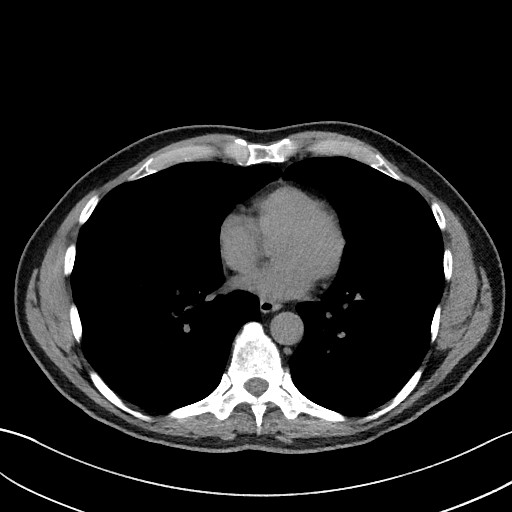
[im 27/70  lung]
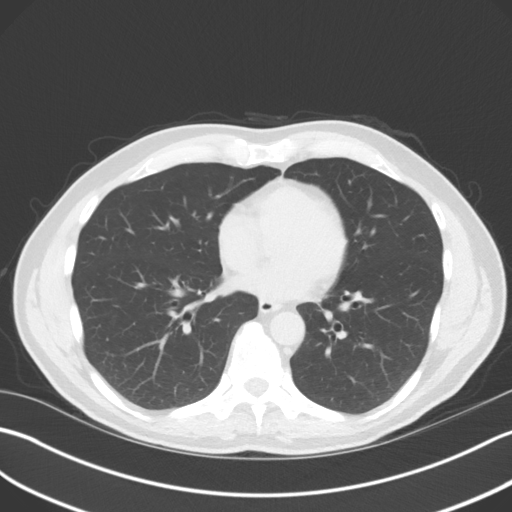
[im 32/70  lung]
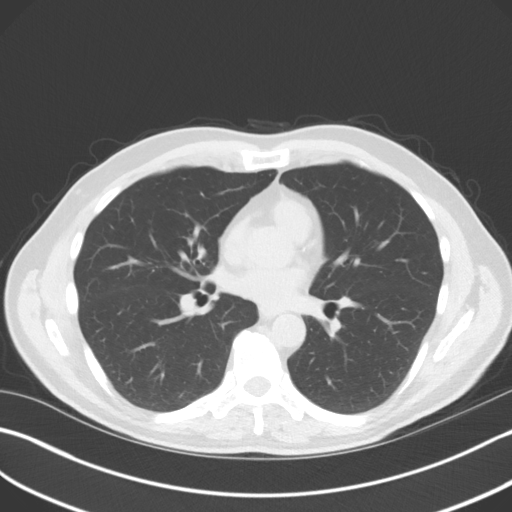
[im 38/70  lung]
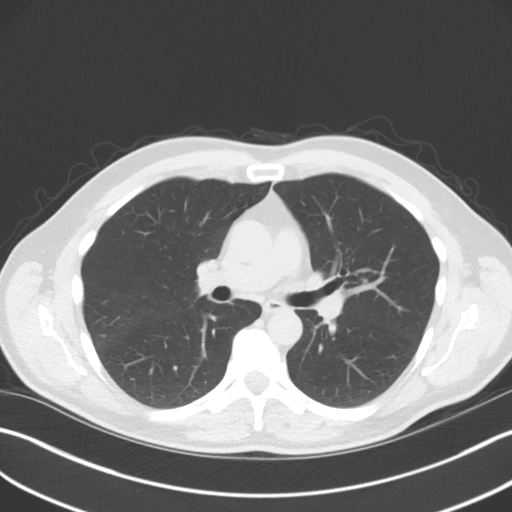
[im 43/70  lung]
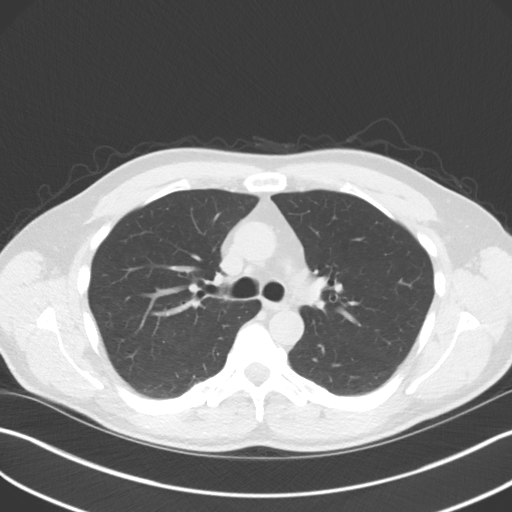
[im 48/70  mediastinal]
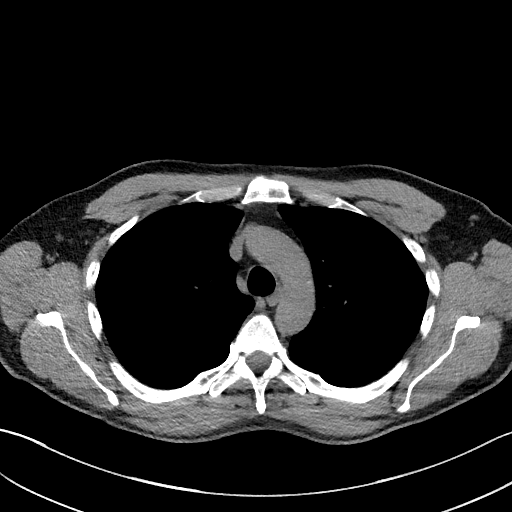
[im 48/70  lung]
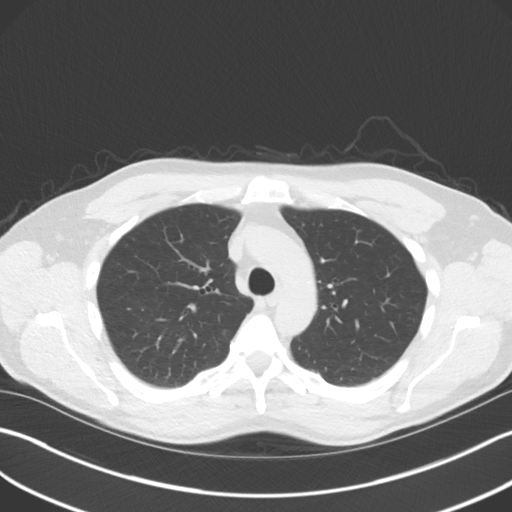
[im 54/70  lung]
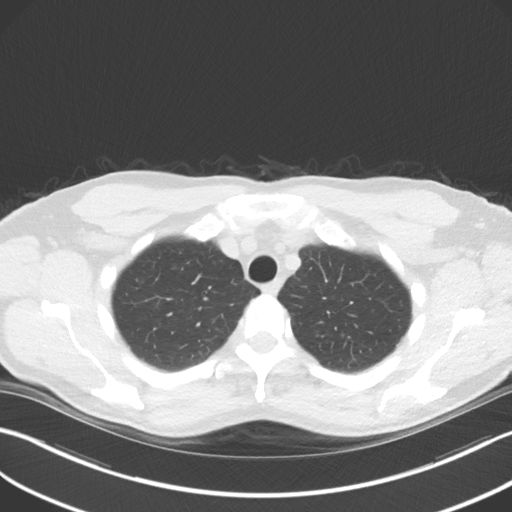
[im 59/70  lung]
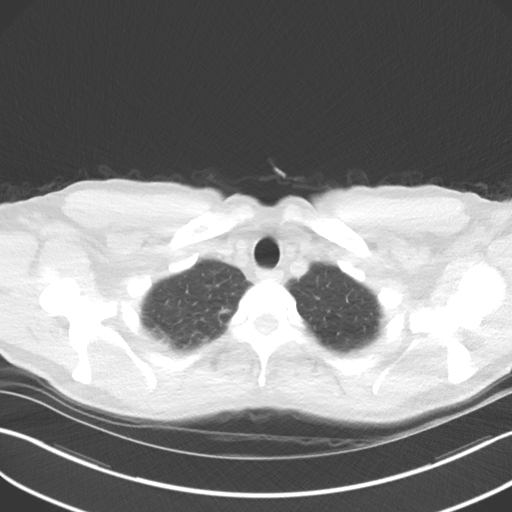
[im 64/70  lung]
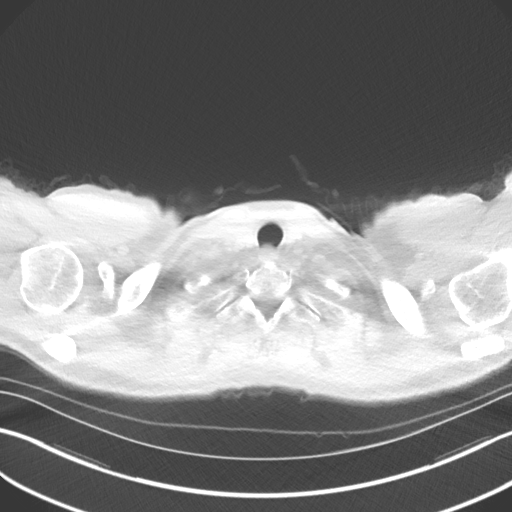

[Series 5: coronal · coronal · 0.67mm/px · 3 of 119 slices shown]
[im 24/119  lung]
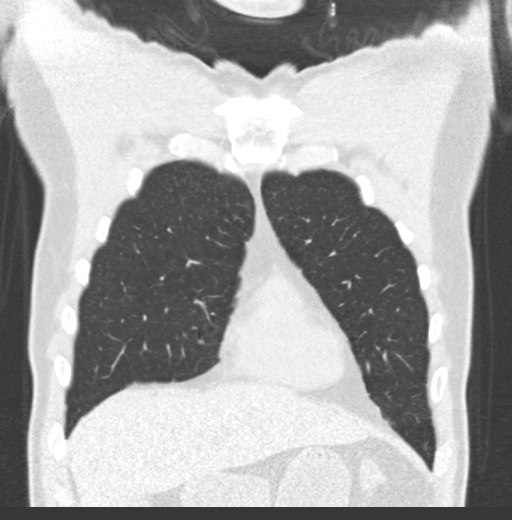
[im 48/119  lung]
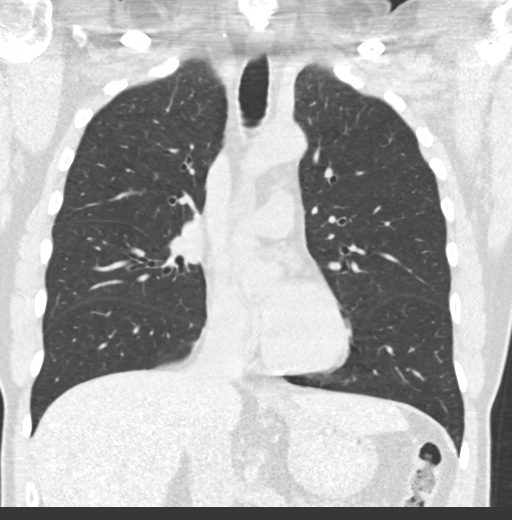
[im 71/119  lung]
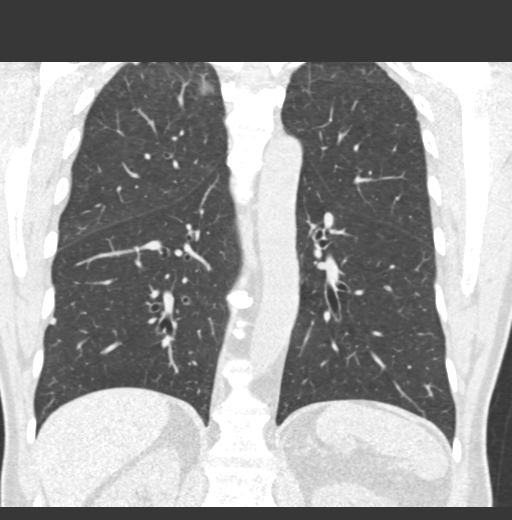

[15 of 40 positions shown; findings below may reference images not displayed]

FINDINGS: Cardiovascular: Minimal aortic atherosclerosis. Coronary artery
calcification is seen predominantly in the left anterior descending
coronary artery. Heart size normal. No pericardial effusion.

Mediastinum/Nodes: No pathologically enlarged mediastinal or
axillary lymph nodes. Hilar regions are difficult to definitively
evaluate without IV contrast. Esophagus is grossly unremarkable.

Lungs/Pleura: Biapical pleuroparenchymal scarring, right greater
than left. An 18.4 mm (equivalent diameter) subpleural lesion in the
posterior aspect of the apical segment right upper lobe (series 3,
image 64) is likely due to scarring. Additional pulmonary nodules
measure 6 mm or less in size. No pleural fluid. Airway is
unremarkable.

Upper Abdomen: Visualized portions of the liver, adrenal glands,
kidneys, spleen, pancreas, stomach and bowel are grossly
unremarkable. No upper abdominal adenopathy.

Musculoskeletal: No worrisome lytic or sclerotic lesions.
Degenerative changes are seen in the spine.
IMPRESSION: 1. Favor asymmetric subpleural scarring in the right upper lobe.
However, by size criteria, lesion is categorized as Lung-RADS
Category 4B, suspicious. Repeat low-dose CT chest without contrast
is recommended in 3 months. These results will be called to the
ordering clinician or representative by the Radiologist Assistant,
and communication documented in the PACS or zVision Dashboard.
2. Aortic atherosclerosis (GAS1E-170.0). Coronary artery
calcification.

## 2018-08-26 ENCOUNTER — Other Ambulatory Visit: Payer: Self-pay | Admitting: Physician Assistant

## 2018-08-26 DIAGNOSIS — S065X9A Traumatic subdural hemorrhage with loss of consciousness of unspecified duration, initial encounter: Secondary | ICD-10-CM

## 2018-08-26 DIAGNOSIS — S065XAA Traumatic subdural hemorrhage with loss of consciousness status unknown, initial encounter: Secondary | ICD-10-CM

## 2018-09-02 ENCOUNTER — Ambulatory Visit
Admission: RE | Admit: 2018-09-02 | Discharge: 2018-09-02 | Disposition: A | Discharge status: Home / Self Care | Payer: No Typology Code available for payment source | Source: Ambulatory Visit | Attending: Physician Assistant | Admitting: Physician Assistant

## 2018-09-02 DIAGNOSIS — S065XAA Traumatic subdural hemorrhage with loss of consciousness status unknown, initial encounter: Secondary | ICD-10-CM

## 2018-09-02 DIAGNOSIS — S065X9A Traumatic subdural hemorrhage with loss of consciousness of unspecified duration, initial encounter: Secondary | ICD-10-CM

## 2019-02-11 ENCOUNTER — Other Ambulatory Visit: Payer: Self-pay | Admitting: *Deleted

## 2019-02-11 DIAGNOSIS — Z122 Encounter for screening for malignant neoplasm of respiratory organs: Secondary | ICD-10-CM

## 2019-02-11 DIAGNOSIS — Z87891 Personal history of nicotine dependence: Secondary | ICD-10-CM

## 2019-03-07 ENCOUNTER — Telehealth: Payer: Self-pay | Admitting: *Deleted

## 2019-03-07 NOTE — Telephone Encounter (Signed)

## 2019-03-10 ENCOUNTER — Ambulatory Visit (INDEPENDENT_AMBULATORY_CARE_PROVIDER_SITE_OTHER)
Admission: RE | Admit: 2019-03-10 | Discharge: 2019-03-10 | Disposition: A | Discharge status: Home / Self Care | Payer: No Typology Code available for payment source | Source: Ambulatory Visit | Attending: Acute Care | Admitting: Acute Care

## 2019-03-10 ENCOUNTER — Other Ambulatory Visit: Payer: Self-pay

## 2019-03-10 DIAGNOSIS — Z87891 Personal history of nicotine dependence: Secondary | ICD-10-CM | POA: Diagnosis not present

## 2019-03-10 DIAGNOSIS — Z122 Encounter for screening for malignant neoplasm of respiratory organs: Secondary | ICD-10-CM

## 2019-03-13 ENCOUNTER — Other Ambulatory Visit: Payer: Self-pay | Admitting: *Deleted

## 2019-03-13 DIAGNOSIS — Z122 Encounter for screening for malignant neoplasm of respiratory organs: Secondary | ICD-10-CM

## 2019-03-13 DIAGNOSIS — Z87891 Personal history of nicotine dependence: Secondary | ICD-10-CM

## 2019-05-06 ENCOUNTER — Other Ambulatory Visit: Payer: Self-pay

## 2019-05-06 ENCOUNTER — Ambulatory Visit (INDEPENDENT_AMBULATORY_CARE_PROVIDER_SITE_OTHER): Payer: No Typology Code available for payment source

## 2019-05-06 DIAGNOSIS — Z23 Encounter for immunization: Secondary | ICD-10-CM

## 2019-06-30 ENCOUNTER — Telehealth: Payer: Self-pay

## 2019-06-30 NOTE — Telephone Encounter (Signed)
Copied from [place] [id number]. Topic: General - Other >> Jun 18, 2019 11:33 AM Leward Quan A wrote: Reason for CRM: Patient called to say that he received a flu shot on 05/06/2019 and that it was improperly coded so now he have received a bill. Asking for a call back at Ph# [phone] >> Jun 30, 2019 12:53 PM Sheran Luz wrote: Patient requesting call back from PA about this issue. Please advise. He is requesting claim be resubmitted, if possible.   >> Jun 19, 2019  3:36 PM Tempie Donning wrote: Durene Cal to [facility]

## 2019-07-18 ENCOUNTER — Other Ambulatory Visit: Payer: Self-pay | Admitting: Family Medicine

## 2019-08-06 ENCOUNTER — Other Ambulatory Visit: Payer: Self-pay

## 2019-08-07 ENCOUNTER — Ambulatory Visit (INDEPENDENT_AMBULATORY_CARE_PROVIDER_SITE_OTHER): Payer: No Typology Code available for payment source | Admitting: Family Medicine

## 2019-08-07 ENCOUNTER — Encounter: Payer: Self-pay | Admitting: Family Medicine

## 2019-08-07 VITALS — BP 118/70 | HR 82 | Temp 98.2°F | Ht 68.0 in | Wt 178.6 lb

## 2019-08-07 DIAGNOSIS — J439 Emphysema, unspecified: Secondary | ICD-10-CM | POA: Diagnosis not present

## 2019-08-07 DIAGNOSIS — Z0001 Encounter for general adult medical examination with abnormal findings: Secondary | ICD-10-CM

## 2019-08-07 DIAGNOSIS — R519 Headache, unspecified: Secondary | ICD-10-CM | POA: Diagnosis not present

## 2019-08-07 DIAGNOSIS — S065X9A Traumatic subdural hemorrhage with loss of consciousness of unspecified duration, initial encounter: Secondary | ICD-10-CM | POA: Diagnosis not present

## 2019-08-07 DIAGNOSIS — Z23 Encounter for immunization: Secondary | ICD-10-CM | POA: Diagnosis not present

## 2019-08-07 DIAGNOSIS — S065XAA Traumatic subdural hemorrhage with loss of consciousness status unknown, initial encounter: Secondary | ICD-10-CM

## 2019-08-07 DIAGNOSIS — R0982 Postnasal drip: Secondary | ICD-10-CM | POA: Diagnosis not present

## 2019-08-07 DIAGNOSIS — Z0000 Encounter for general adult medical examination without abnormal findings: Secondary | ICD-10-CM

## 2019-08-07 DIAGNOSIS — I700 Atherosclerosis of aorta: Secondary | ICD-10-CM

## 2019-08-07 DIAGNOSIS — E039 Hypothyroidism, unspecified: Secondary | ICD-10-CM | POA: Diagnosis not present

## 2019-08-07 DIAGNOSIS — E785 Hyperlipidemia, unspecified: Secondary | ICD-10-CM

## 2019-08-07 DIAGNOSIS — Z125 Encounter for screening for malignant neoplasm of prostate: Secondary | ICD-10-CM

## 2019-08-07 LAB — POC URINALSYSI DIPSTICK (AUTOMATED)
Bilirubin, UA: NEGATIVE
Blood, UA: NEGATIVE
Glucose, UA: NEGATIVE
Ketones, UA: NEGATIVE
Leukocytes, UA: NEGATIVE
Nitrite, UA: NEGATIVE
Protein, UA: NEGATIVE
Spec Grav, UA: 1.015 (ref 1.010–1.025)
Urobilinogen, UA: 0.2 E.U./dL
pH, UA: 6 (ref 5.0–8.0)

## 2019-08-07 MED ORDER — FLUTICASONE PROPIONATE 50 MCG/ACT NA SUSP: 2.0000 | Freq: Every day | NASAL | 6 refills | Status: DC

## 2019-08-07 NOTE — Addendum Note (Signed)
Addended by: Francis Dowse T on: 08/07/2019 11:16 AM   Modules accepted: Orders

## 2019-08-07 NOTE — Addendum Note (Signed)
Addended by: Francis Dowse T on: 08/07/2019 11:17 AM   Modules accepted: Orders

## 2019-08-07 NOTE — Patient Instructions (Addendum)
Health Maintenance Due  Topic Date Due  . PNA vac Low Risk Adult (2 of 2 - PPSV23)- today.  05/08/2019   I would feel better if we wait on shingrix until after head CT. You can call back for nurse visit if head ct ok   Sit tight in room for head CT  If head CT ok- we need to get you into som regular exercise! Walking perhaps 15 minutes a day and building up to 30 minutes would be one possibility.   Try flonase for post nasal drip- would at least give 2 week trial. If improves but doesn't resolve could add on xyzal or claritin or allegra or zyrtec before bed to see if that helps further.   Recommended follow up: Return in about 1 year (around 08/06/2020) for physical or sooner if needed.

## 2019-08-07 NOTE — Progress Notes (Signed)
Phone [phone] In person visit   Subjective:   Joseph Ford is a 68 y.o. year old very pleasant male patient who presents for/with See problem oriented charting- headache/history subdural  ROS- No facial or extremity weakness. No slurred words or trouble swallowing. no blurry vision or double vision. No paresthesias. No confusion or word finding difficulties.    This visit occurred during the SARS-CoV-2 public health emergency.  Safety protocols were in place, including screening questions prior to the visit, additional usage of staff PPE, and extensive cleaning of exam room while observing appropriate contact time as indicated for disinfecting solutions.   Past Medical History-  Patient Active Problem List   Diagnosis Date Noted  . 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 Atypical chest pain suspect costochondritis but risk factors concerning 12/31/2014    Priority: High  . Former smoker 09/17/2006    Priority: High  . Hypothyroidism 12/31/2014    Priority: Medium  . Dyslipidemia 09/17/2006    Priority: Medium  . Emphysema of lung ([place]) 08/05/2018    Priority: Low  . Aortic atherosclerosis ([place]) 07/11/2017    Priority: Low  . History of squamous cell carcinoma of skin 10/18/2016    Priority: Low    Medications- reviewed and updated Current Outpatient Medications  Medication Sig Dispense Refill  . atorvastatin (LIPITOR) 20 MG tablet Take 1 tablet by mouth once daily 90 tablet 0  . levothyroxine (SYNTHROID) 125 MCG tablet Take 1 tablet by mouth once daily 90 tablet 0   No current facility-administered medications for this visit.     Objective:  BP 118/70   Pulse 82   Temp 98.2 F (36.8 C) (Temporal)   Ht 5\' 8"  (1.727 m)   Wt 178 lb 9.6 oz (81 kg)   SpO2 95%   BMI 27.16 kg/m  Neuro: CN II-XII intact, sensation and reflexes normal throughout, 5/5 muscle strength in bilateral upper and lower extremities. Normal finger to nose. Normal rapid  alternating movements. No pronator drift. Normal romberg. Normal gait.     Assessment and Plan   #History of Subdural hematoma ([place]) s/p evacuation with craniotomy 06/2018/ new headache S:Patient has had two headaches in the last few months. Symptoms were similar to before his brain surgery. One lasted about a week and the other for around two weeks. He had some decreased hearing in left ear, sensitivity to noise. He did take some norco that he had at home from last hospitalization that helped some with symptoms. This resolved about 1.5 weeks ago  Since surgery has had some intermittent headaches but nothing as long or as strong as these- pain got up to 6/10 and posterior- and that is where headaches were previously. No head trauma.  A/P: Due to patient history of subdural hematoma with craniotomy for evacuation- we have a very low threshold to scan his head. We discussed today and will get Stat head CT even though he is not currently having a headache- he had a similar pattern last year before subdural was discovered- has had intermittent headaches since then but last 2 eerily similar to him to prior to subdural. .    # Post nasale drip  S:ongoing for over a year. Does cause a dry cough at times. Feels like he has to clear throat frequently.  A/P: possible allergic rhinitis though not clearly seasonal- we will trial flonase for this.    Pulmonary emphysema S: per prior CT scan.  Patient denies any symptoms-though  intermittent dry cough could be related. A/P: dry cough could be from COPD but also from post nasal drip. Will treat post nasal drip as above to see if improves  Recommended follow up: agrees to return if worsening symptoms even with normal head CT- in that case might get neurosurgery's opinion   Lab/Order associations:   ICD-10-CM   1. Subdural hematoma ([place])  S06.5X9A   2. Nonintractable headache, unspecified chronicity pattern, unspecified headache type  R51.9    3. Postnasal  drip   4. Pulmonary emphysema, unspecified emphysema type ([place])      No orders of the defined types were placed in this encounter.   Return precautions advised.  Garret Reddish, MD

## 2019-08-07 NOTE — Addendum Note (Signed)
Addended by: Francella Solian on: 08/07/2019 11:02 AM   Modules accepted: Orders

## 2019-08-07 NOTE — Progress Notes (Signed)
Phone: [phone]   Subjective:  Patient presents today for their annual physical. Chief complaint-noted.   See problem oriented charting- Review of Systems  Constitutional: Negative.   HENT: Positive for hearing loss (stable for years- did not worsen with headaches).        Only with headaches   Eyes: Negative.   Respiratory: Negative.   Cardiovascular: Negative.   Gastrointestinal: Positive for heartburn.       OTC medications control   Genitourinary: Negative.   Musculoskeletal: Negative.   Skin: Negative.   Neurological: Positive for headaches.       Have had 2 lasting for around a week or more. No symptoms for last 2 weeks.   Endo/Heme/Allergies: Negative.   Psychiatric/Behavioral: Negative.    The following were reviewed and entered/updated in epic: Past Medical History:  Diagnosis Date  . Hyperlipidemia   . Hypothyroidism    Patient Active Problem List   Diagnosis Date Noted  . 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 Atypical chest pain suspect costochondritis but risk factors concerning 12/31/2014    Priority: High  . Former smoker 09/17/2006    Priority: High  . Hypothyroidism 12/31/2014    Priority: Medium  . Dyslipidemia 09/17/2006    Priority: Medium  . Emphysema of lung ([place]) 08/05/2018    Priority: Low  . Aortic atherosclerosis ([place]) 07/11/2017    Priority: Low  . History of squamous cell carcinoma of skin 10/18/2016    Priority: Low   Past Surgical History:  Procedure Laterality Date  . CRANIOTOMY Left 07/16/2018   Procedure: CRANIOTOMY HEMATOMA EVACUATION SUBDURAL;  Surgeon: Consuella Lose, MD;  Location: [place];  Service: Neurosurgery;  Laterality: Left;  . left eye surgery  1959   at [facility]    Family History  Problem Relation Age of Onset  . Hypertension Mother   . Heart disease Mother 40       died of heart attack  . Heart attack Father   . Heart disease Father 70       "mild heart attack"  . Cancer  Sister        breast  . Breast cancer Sister 52  . Diabetes Maternal Aunt   . Diabetes Maternal Grandmother   . Thyroid disease Sister        unremoved, unclear reason    Medications- reviewed and updated Current Outpatient Medications  Medication Sig Dispense Refill  . atorvastatin (LIPITOR) 20 MG tablet Take 1 tablet by mouth once daily 90 tablet 0  . levothyroxine (SYNTHROID) 125 MCG tablet Take 1 tablet by mouth once daily 90 tablet 0  . fluticasone (FLONASE) 50 MCG/ACT nasal spray Place 2 sprays into both nostrils daily. 16 g 6   No current facility-administered medications for this visit.    Allergies-reviewed and updated No Known Allergies  Social History   Social History Narrative   Divorced. No children.    Lives with sister and husband who live in his basement. Has another sister that lives with him in his house.       Retired from post office.       Hobbies: yardwork, Architect work            Objective  Objective:  BP 118/70   Pulse 82   Temp 98.2 F (36.8 C) (Temporal)   Ht 5\' 8"  (1.727 m)   Wt 178 lb 9.6 oz (81 kg)   SpO2 95%   BMI 27.16 kg/m  Gen: NAD, resting comfortably HEENT: Mucous membranes are moist. Oropharynx normal. Mask had to be removed for neuro exam so we took opportunity to examine mucus membranes.  Neck: no thyromegaly CV: RRR no murmurs rubs or gallops Lungs: CTAB no crackles, wheeze, rhonchi Abdomen: soft/nontender/nondistended/normal bowel sounds. No rebound or guarding.  Ext: no edema Skin: warm, dry Neuro: grossly normal, moves all extremities, see separate note for full neuro exam Rectal: normal tone, slight diffusely enlarged prostate, no masses or tenderness   Assessment and Plan  68 y.o. male presenting for annual physical.  Health Maintenance counseling: 1. Anticipatory guidance: Patient counseled regarding regular dental exams q6 months, eye exams yearly,  avoiding smoking and second hand smoke , limiting alcohol  to 2 beverages per day . Does not drink at all. Has not had any in several years.    2. Risk factor reduction:  Advised patient of need for regular exercise and diet rich and fruits and vegetables to reduce risk of heart attack and stroke. Exercise- does not exercise but does do things around the house- encouraged him to exercise . Diet-he does feel like is diet is ok. He does not have guidelines but feels that it is a good diet. He is aware that he could work on portion control.  Weight is up 10 pounds over the last year and we discussed overweight status.  Wt Readings from Last 3 Encounters:  08/07/19 178 lb 9.6 oz (81 kg)  08/05/18 168 lb 3.2 oz (76.3 kg)  07/16/18 168 lb 3.4 oz (76.3 kg)  3. Immunizations/screenings/ancillary studies-recommended Pneumovax 23- due today.  Also recommended Shingrix- will defer until headaches have resolved. Still on private insurance. Immunization History  Administered Date(s) Administered  . Fluad Quad(high Dose 65+) 05/06/2019  . Influenza, High Dose Seasonal PF 05/07/2018  . Influenza,inj,Quad PF,6+ Mos 05/11/2014, 06/16/2016, 07/11/2017  . Influenza,inj,quad, With Preservative 06/11/2015  . Pneumococcal Conjugate-13 05/07/2018  . Pneumococcal Polysaccharide-23 04/04/2013  . Td 09/17/2006  . Tdap 07/11/2017  . Zoster 04/04/2013   4. Prostate cancer screening- Is due for screening today-PSA with slight uptake last year.  Rectal exam today . Slight BPH. Has some nocturia Lab Results  Component Value Date   PSA 1.4 08/05/2018   PSA 0.8 07/11/2017   PSA 1.0 06/06/2016   5. Colon cancer screening -Last Cologuard done 07/2017 not due until 07/24/2020.  6. Skin cancer screening- followed by Dem[facility] last appointment was in May. Seen at Skin surgery center.  advised regular sunscreen use. Denies worrisome, changing, or new skin lesions.  7. former smoker. Quit in 2019. Was a smoker for around 56 yrs at 1/2 to over a pack a day.  We will get urinalysis.  AAA  screening December 2019.  He is enrolled in the lung cancer screening program  Status of chronic or acute concerns   Dyslipidemia/Aortic atherosclerosis (HCC)-patient on atorvastatin 20 mg.  We will continue risk factor modification to reduce risk of progression of aortic atherosclerosis.  Update lipid panel today  Hypothyroidism, unspecified type-patient is compliant with his levothyroxine.  We will update TSH today with labs  Recommended follow up: Return in about 1 year (around 08/06/2020) for physical or sooner if needed.  Lab/Order associations: Fasting today only had black coffle.    ICD-10-CM   1. Preventative health care  Z00.00 CBC with Differential/Platelet    Comprehensive metabolic panel    Lipid panel    PSA    TSH    POCT Urinalysis Dipstick (Automated)  2. Dyslipidemia  E78.5 CBC with Differential/Platelet    Comprehensive metabolic panel    Lipid panel    POCT Urinalysis Dipstick (Automated)  3. Aortic atherosclerosis (HCC)  I70.0   4. Hypothyroidism, unspecified type  E03.9 TSH  5. Screening for prostate cancer  Z12.5 PSA   Return precautions advised.  Garret Reddish, MD

## 2019-08-07 NOTE — Addendum Note (Signed)
Addended by: Francella Solian on: 08/07/2019 10:56 AM   Modules accepted: Orders

## 2019-08-08 ENCOUNTER — Other Ambulatory Visit: Payer: Self-pay

## 2019-08-08 ENCOUNTER — Ambulatory Visit (HOSPITAL_COMMUNITY)
Admission: RE | Admit: 2019-08-08 | Discharge: 2019-08-08 | Disposition: A | Discharge status: Home / Self Care | Payer: No Typology Code available for payment source | Source: Ambulatory Visit | Attending: Family Medicine | Admitting: Family Medicine

## 2019-08-08 ENCOUNTER — Encounter (HOSPITAL_COMMUNITY): Payer: Self-pay

## 2019-08-08 ENCOUNTER — Encounter: Payer: Self-pay | Admitting: Family Medicine

## 2019-08-08 DIAGNOSIS — R519 Headache, unspecified: Secondary | ICD-10-CM | POA: Diagnosis not present

## 2019-08-08 LAB — CBC WITH DIFFERENTIAL/PLATELET
Absolute Monocytes: 431 cells/uL (ref 200–950)
Basophils Absolute: 73 cells/uL (ref 0–200)
Basophils Relative: 1.3 %
Eosinophils Absolute: 101 cells/uL (ref 15–500)
Eosinophils Relative: 1.8 %
HCT: 41.5 % (ref 38.5–50.0)
Hemoglobin: 14.4 g/dL (ref 13.2–17.1)
Lymphs Abs: 1742 cells/uL (ref 850–3900)
MCH: 31.8 pg (ref 27.0–33.0)
MCHC: 34.7 g/dL (ref 32.0–36.0)
MCV: 91.6 fL (ref 80.0–100.0)
MPV: 10.2 fL (ref 7.5–12.5)
Monocytes Relative: 7.7 %
Neutro Abs: 3254 cells/uL (ref 1500–7800)
Neutrophils Relative %: 58.1 %
Platelets: 276 10*3/uL (ref 140–400)
RBC: 4.53 10*6/uL (ref 4.20–5.80)
RDW: 13 % (ref 11.0–15.0)
Total Lymphocyte: 31.1 %
WBC: 5.6 10*3/uL (ref 3.8–10.8)

## 2019-08-08 LAB — COMPREHENSIVE METABOLIC PANEL
AG Ratio: 1.7 (calc) (ref 1.0–2.5)
ALT: 25 U/L (ref 9–46)
AST: 16 U/L (ref 10–35)
Albumin: 4.4 g/dL (ref 3.6–5.1)
Alkaline phosphatase (APISO): 49 U/L (ref 35–144)
BUN: 14 mg/dL (ref 7–25)
CO2: 26 mmol/L (ref 20–32)
Calcium: 9.7 mg/dL (ref 8.6–10.3)
Chloride: 106 mmol/L (ref 98–110)
Creat: 0.99 mg/dL (ref 0.70–1.25)
Globulin: 2.6 g/dL (calc) (ref 1.9–3.7)
Glucose, Bld: 105 mg/dL — ABNORMAL HIGH (ref 65–99)
Potassium: 4.2 mmol/L (ref 3.5–5.3)
Sodium: 141 mmol/L (ref 135–146)
Total Bilirubin: 0.6 mg/dL (ref 0.2–1.2)
Total Protein: 7 g/dL (ref 6.1–8.1)

## 2019-08-08 LAB — TSH: TSH: 0.74 mIU/L (ref 0.40–4.50)

## 2019-08-08 LAB — LIPID PANEL
Cholesterol: 129 mg/dL (ref <200)
HDL: 38 mg/dL — ABNORMAL LOW (ref >=40)
LDL Cholesterol (Calc): 72 mg/dL (calc)
Non-HDL Cholesterol (Calc): 91 mg/dL (calc) (ref <130)
Total CHOL/HDL Ratio: 3.4 (calc) (ref <5.0)
Triglycerides: 103 mg/dL (ref <150)

## 2019-08-08 LAB — PSA: PSA: 1 ng/mL (ref <=4.0)

## 2019-08-08 NOTE — Progress Notes (Signed)
Great news before the holidays-prior subdural hematoma has resolved.  There is no evidence of a new bleed or stroke or mass as cause of headache.  I feel very reassured but still if you were to have a worsening headache pattern I would want you to follow-up with [place].

## 2019-08-27 ENCOUNTER — Telehealth: Payer: Self-pay | Admitting: Family Medicine

## 2019-08-27 NOTE — Telephone Encounter (Signed)
Patient called in a stated that [facility] said his levothyroxine (SYNTHROID) 125 MCG tablet the brand he usually get the no longer carry and would need to be resent with a different brand. Patient stated that [facility] had faxed over a new prescription.   Please advise.

## 2019-08-28 NOTE — Telephone Encounter (Signed)
Please advise know we will need to get lab app for 6 weeks after change

## 2019-08-28 NOTE — Telephone Encounter (Signed)
Sorry-continuation of prior message-please order a TSH for 6 weeks after he makes the change

## 2019-08-28 NOTE — Telephone Encounter (Signed)
Yes thanks-you may send a refill and say okay to change manufacturer.tsh

## 2019-08-29 ENCOUNTER — Other Ambulatory Visit: Payer: Self-pay

## 2019-08-29 DIAGNOSIS — E039 Hypothyroidism, unspecified: Secondary | ICD-10-CM

## 2019-08-29 NOTE — Telephone Encounter (Signed)
[place] has been given for change. Called patient and let them know and set up lab appointment for 6 weeks. We have also placed order for labs.

## 2019-10-13 ENCOUNTER — Other Ambulatory Visit: Payer: Self-pay

## 2019-10-15 ENCOUNTER — Other Ambulatory Visit (INDEPENDENT_AMBULATORY_CARE_PROVIDER_SITE_OTHER): Payer: No Typology Code available for payment source

## 2019-10-15 ENCOUNTER — Other Ambulatory Visit: Payer: Self-pay

## 2019-10-15 DIAGNOSIS — E039 Hypothyroidism, unspecified: Secondary | ICD-10-CM

## 2019-10-15 NOTE — Addendum Note (Signed)
Addended by: Francis Dowse T on: 10/15/2019 08:32 AM   Modules accepted: Orders

## 2019-10-16 ENCOUNTER — Other Ambulatory Visit: Payer: Self-pay | Admitting: Family Medicine

## 2019-10-16 DIAGNOSIS — E039 Hypothyroidism, unspecified: Secondary | ICD-10-CM

## 2019-10-16 LAB — TSH: TSH: 8.68 mIU/L — ABNORMAL HIGH (ref 0.40–4.50)

## 2019-10-16 MED ORDER — LEVOTHYROXINE SODIUM 125 MCG PO TABS: ORAL_TABLET | ORAL | 2 refills | Status: DC

## 2019-10-16 NOTE — Progress Notes (Signed)
Thyroid remains slightly under treated. Please adjust levothyroxine to 125 mcg daily plus an additional half tablet one day a week (send in new rx- 34 pills per month or 102 for 3 months) and help him set up a repeat tsh under hypothyroidism in 6 weeks.

## 2019-10-24 ENCOUNTER — Other Ambulatory Visit: Payer: Self-pay | Admitting: Family Medicine

## 2019-10-24 MED ORDER — ATORVASTATIN CALCIUM 20 MG PO TABS: 20.0000 mg | ORAL_TABLET | Freq: Every day | ORAL | 0 refills | Status: DC

## 2019-11-12 ENCOUNTER — Encounter: Payer: Self-pay | Admitting: Family Medicine

## 2019-11-27 ENCOUNTER — Other Ambulatory Visit (INDEPENDENT_AMBULATORY_CARE_PROVIDER_SITE_OTHER): Payer: No Typology Code available for payment source

## 2019-11-27 ENCOUNTER — Other Ambulatory Visit: Payer: Self-pay

## 2019-11-27 DIAGNOSIS — E039 Hypothyroidism, unspecified: Secondary | ICD-10-CM

## 2019-11-27 LAB — TSH: TSH: 3.82 mIU/L (ref 0.40–4.50)

## 2019-12-03 ENCOUNTER — Telehealth: Payer: Self-pay | Admitting: Family Medicine

## 2019-12-03 NOTE — Telephone Encounter (Signed)
Left message for patient to call back and schedule Medicare Annual Wellness Visit (AWV) either virtually/audio only OR in office. Whatever the patients preference is.  No hx; please schedule at anytime with [facility]-Nurse Health Advisor at [facility].

## 2019-12-03 NOTE — Telephone Encounter (Signed)
Patient called and declined.

## 2020-01-19 ENCOUNTER — Other Ambulatory Visit: Payer: Self-pay | Admitting: Family Medicine

## 2020-02-14 ENCOUNTER — Other Ambulatory Visit: Payer: Self-pay | Admitting: Family Medicine

## 2020-02-16 MED ORDER — ATORVASTATIN CALCIUM 20 MG PO TABS: 20.0000 mg | ORAL_TABLET | Freq: Every day | ORAL | 0 refills | Status: DC

## 2020-02-16 MED ORDER — LEVOTHYROXINE SODIUM 125 MCG PO TABS: ORAL_TABLET | ORAL | 0 refills | Status: DC

## 2020-02-16 NOTE — Telephone Encounter (Signed)
Message routed to PCP CMA   This message was sent to me

## 2020-02-16 NOTE — Telephone Encounter (Signed)
Message routed to PCP CMA  

## 2020-02-17 MED ORDER — ATORVASTATIN CALCIUM 20 MG PO TABS: 20.0000 mg | ORAL_TABLET | Freq: Every day | ORAL | 2 refills | Status: DC

## 2020-02-17 MED ORDER — LEVOTHYROXINE SODIUM 125 MCG PO TABS: ORAL_TABLET | ORAL | 2 refills | Status: DC

## 2020-02-17 NOTE — Telephone Encounter (Signed)
Rx's sent to pharmacy.  

## 2020-02-17 NOTE — Addendum Note (Signed)
Addended by: Marian Sorrow on: 02/17/2020 11:15 AM   Modules accepted: Orders

## 2020-03-17 ENCOUNTER — Other Ambulatory Visit: Payer: Self-pay | Admitting: Family Medicine

## 2020-03-18 ENCOUNTER — Ambulatory Visit (INDEPENDENT_AMBULATORY_CARE_PROVIDER_SITE_OTHER)
Admission: RE | Admit: 2020-03-18 | Discharge: 2020-03-18 | Disposition: A | Discharge status: Home / Self Care | Payer: No Typology Code available for payment source | Source: Ambulatory Visit | Attending: Acute Care | Admitting: Acute Care

## 2020-03-18 ENCOUNTER — Other Ambulatory Visit: Payer: Self-pay

## 2020-03-18 DIAGNOSIS — Z122 Encounter for screening for malignant neoplasm of respiratory organs: Secondary | ICD-10-CM

## 2020-03-18 DIAGNOSIS — Z87891 Personal history of nicotine dependence: Secondary | ICD-10-CM | POA: Diagnosis not present

## 2020-03-29 NOTE — Progress Notes (Signed)
Please call patient and let them  know their  low dose Ct was read as a,Lung RADS 2: nodules that are benign in appearance and behavior with a very low likelihood of becoming a clinically active cancer due to size or lack of growth. Recommendation per radiology is for a repeat LDCT in 12 months. .Please let them  know we will order and schedule their  annual screening scan for 02/2021. Please let them  know there was notation of CAD on their  scan.  Please remind the patient  that this is a non-gated exam therefore degree or severity of disease  cannot be determined. Please have them  follow up with their PCP regarding potential risk factor modification, dietary therapy or pharmacologic therapy if clinically indicated. Pt.  is  currently on statin therapy. Please place order for annual  screening scan for  02/2021 and fax results to PCP. Thanks so much.

## 2020-03-30 ENCOUNTER — Telehealth: Payer: Self-pay | Admitting: Acute Care

## 2020-03-30 DIAGNOSIS — Z87891 Personal history of nicotine dependence: Secondary | ICD-10-CM

## 2020-03-30 NOTE — Telephone Encounter (Signed)
Pt informed of CT results per Sarah Groce, NP.  PT verbalized understanding.  Copy sent to PCP.  Order placed for 1 yr f/u CT.  

## 2020-06-16 ENCOUNTER — Other Ambulatory Visit: Payer: Self-pay | Admitting: Family Medicine

## 2020-07-19 ENCOUNTER — Other Ambulatory Visit: Payer: Self-pay | Admitting: Family Medicine

## 2020-08-11 ENCOUNTER — Encounter: Payer: Self-pay | Admitting: Family Medicine

## 2020-08-11 ENCOUNTER — Other Ambulatory Visit: Payer: Self-pay

## 2020-08-11 ENCOUNTER — Ambulatory Visit (INDEPENDENT_AMBULATORY_CARE_PROVIDER_SITE_OTHER): Payer: No Typology Code available for payment source | Admitting: Family Medicine

## 2020-08-11 VITALS — BP 118/78 | HR 86 | Temp 98.5°F | Resp 18 | Ht 68.0 in | Wt 182.2 lb

## 2020-08-11 DIAGNOSIS — S065X9A Traumatic subdural hemorrhage with loss of consciousness of unspecified duration, initial encounter: Secondary | ICD-10-CM

## 2020-08-11 DIAGNOSIS — Z0000 Encounter for general adult medical examination without abnormal findings: Secondary | ICD-10-CM

## 2020-08-11 DIAGNOSIS — Z87891 Personal history of nicotine dependence: Secondary | ICD-10-CM

## 2020-08-11 DIAGNOSIS — Z125 Encounter for screening for malignant neoplasm of prostate: Secondary | ICD-10-CM

## 2020-08-11 DIAGNOSIS — Z9889 Other specified postprocedural states: Secondary | ICD-10-CM | POA: Diagnosis not present

## 2020-08-11 DIAGNOSIS — Z1211 Encounter for screening for malignant neoplasm of colon: Secondary | ICD-10-CM

## 2020-08-11 DIAGNOSIS — E785 Hyperlipidemia, unspecified: Secondary | ICD-10-CM

## 2020-08-11 DIAGNOSIS — J439 Emphysema, unspecified: Secondary | ICD-10-CM

## 2020-08-11 DIAGNOSIS — Z23 Encounter for immunization: Secondary | ICD-10-CM

## 2020-08-11 DIAGNOSIS — E039 Hypothyroidism, unspecified: Secondary | ICD-10-CM

## 2020-08-11 DIAGNOSIS — I700 Atherosclerosis of aorta: Secondary | ICD-10-CM

## 2020-08-11 DIAGNOSIS — Z8679 Personal history of other diseases of the circulatory system: Secondary | ICD-10-CM

## 2020-08-11 DIAGNOSIS — S065XAA Traumatic subdural hemorrhage with loss of consciousness status unknown, initial encounter: Secondary | ICD-10-CM

## 2020-08-11 LAB — POC URINALSYSI DIPSTICK (AUTOMATED)
Bilirubin, UA: NEGATIVE
Blood, UA: NEGATIVE
Glucose, UA: NEGATIVE
Ketones, UA: NEGATIVE
Leukocytes, UA: NEGATIVE
Nitrite, UA: NEGATIVE
Protein, UA: NEGATIVE
Spec Grav, UA: 1.015 (ref 1.010–1.025)
Urobilinogen, UA: 0.2 E.U./dL
pH, UA: 5.5 (ref 5.0–8.0)

## 2020-08-11 MED ORDER — LEVOTHYROXINE SODIUM 125 MCG PO TABS: ORAL_TABLET | ORAL | 11 refills | Status: DC

## 2020-08-11 MED ORDER — ATORVASTATIN CALCIUM 20 MG PO TABS: 20.0000 mg | ORAL_TABLET | Freq: Every day | ORAL | 11 refills | Status: DC

## 2020-08-11 NOTE — Progress Notes (Signed)
Phone: [phone]   Subjective:  Patient presents today for their annual physical. Chief complaint-noted.   See problem oriented charting- ROS- full  review of systems was completed and negative  except for: tinnitus no recent change  The following were reviewed and entered/updated in epic: Past Medical History:  Diagnosis Date  . Hyperlipidemia   . Hypothyroidism    Patient Active Problem List   Diagnosis Date Noted  . 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 Atypical chest pain suspect costochondritis but risk factors concerning 12/31/2014    Priority: High  . Former smoker 09/17/2006    Priority: High  . Hypothyroidism 12/31/2014    Priority: Medium  . Dyslipidemia 09/17/2006    Priority: Medium  . Emphysema of lung ([place]) 08/05/2018    Priority: Low  . Aortic atherosclerosis ([place]) 07/11/2017    Priority: Low  . History of squamous cell carcinoma of skin 10/18/2016    Priority: Low   Past Surgical History:  Procedure Laterality Date  . CRANIOTOMY Left 07/16/2018   Procedure: CRANIOTOMY HEMATOMA EVACUATION SUBDURAL;  Surgeon: Consuella Lose, MD;  Location: [facility];  Service: Neurosurgery;  Laterality: Left;  . left eye surgery  1959   at [facility] eyes    Family History  Problem Relation Age of Onset  . Hypertension Mother   . Heart disease Mother 47       died of heart attack  . Heart attack Father   . Heart disease Father 69       "mild heart attack"  . Cancer Sister        breast  . Breast cancer Sister 70  . Diabetes Maternal Aunt   . Diabetes Maternal Grandmother   . Thyroid disease Sister        unremoved, unclear reason    Medications- reviewed and updated Current Outpatient Medications  Medication Sig Dispense Refill  . atorvastatin (LIPITOR) 20 MG tablet Take 1 tablet (20 mg total) by mouth daily. 30 tablet 11  . fluticasone (FLONASE) 50 MCG/ACT nasal spray Place 2 sprays into both nostrils daily. 16 g 6  .  levothyroxine (SYNTHROID) 125 MCG tablet TAKE 1 TABLET BY MOUTH ONCE DAILY AND ADDITIONAL 1/2 TABLET ONE DAY A WEEK 34 tablet 11   No current facility-administered medications for this visit.    Allergies-reviewed and updated No Known Allergies  Social History   Social History Narrative   Divorced. No children.    Lives with sister and husband who live in his basement. Has another sister that lives with him in his house.       Retired from post office.       Hobbies: yardwork, Architect work            Objective  Objective:  BP 118/78   Pulse 86   Temp 98.5 F (36.9 C) (Temporal)   Resp 18   Ht 5\' 8"  (1.727 m)   Wt 182 lb 3.2 oz (82.6 kg)   SpO2 91%   BMI 27.70 kg/m  Gen: NAD, resting comfortably HEENT: Mucous membranes are moist. Oropharynx normal Neck: no thyromegaly CV: RRR no murmurs rubs or gallops Lungs: CTAB no crackles, wheeze, rhonchi Abdomen: soft/nontender/nondistended/normal bowel sounds. No rebound or guarding.  Ext: no edema Skin: warm, dry, scar on left scalp from craniotomy Neuro: CN II-XII intact, sensation and reflexes normal throughout, 5/5 muscle strength in bilateral upper and lower extremities. Normal finger to nose. Normal rapid alternating movements. No pronator drift.  Normal romberg. Normal gait.     Assessment and Plan  69 y.o. male presenting for annual physical.  Health Maintenance counseling: 1. Anticipatory guidance: Patient counseled regarding regular dental exams q6 months, eye exams yearly,  avoiding smoking and second hand smoke, limiting alcohol to 2 beverages per day- does not drink at all- has not had in several years.   2. Risk factor reduction:  Advised patient of need for regular exercise and diet rich and fruits and vegetables to reduce risk of heart attack and stroke. Exercise- encouraged 150 minutes of exercise per week ut want him to see neurosurgery first for clearance with headaches. Diet-needs to make some changes.  up  4 ls from last year and 14 lbs from 2 years ago- recommended 5-10 lbs weight loss.  Wt Readings from Last 3 Encounters:  08/11/20 182 lb 3.2 oz (82.6 kg)  08/07/19 178 lb 9.6 oz (81 kg)  08/05/18 168 lb 3.2 oz (76.3 kg)  3. Immunizations/screenings/ancillary studies- flu shot today. Opting out of covid vaccine for now Immunization History  Administered Date(s) Administered  . Fluad Quad(high Dose 65+) 05/06/2019, 08/11/2020  . Influenza, High Dose Seasonal PF 05/07/2018  . Influenza,inj,Quad PF,6+ Mos 05/11/2014, 06/16/2016, 07/11/2017  . Influenza,inj,quad, With Preservative 06/11/2015  . Pneumococcal Conjugate-13 05/07/2018  . Pneumococcal Polysaccharide-23 04/04/2013, 08/07/2019  . Td 09/17/2006  . Tdap 07/11/2017  . Zoster 04/04/2013  4. Prostate cancer screening- will trend PSA with labs. Rectal exam with slight BPH last year. Nocturia once a night stable or none  Lab Results  Component Value Date   PSA 1.0 08/07/2019   PSA 1.4 08/05/2018   PSA 0.8 07/11/2017   5. Colon cancer screening - last cologuard 07/2017- placed order today for repeat 6. Skin cancer screening- skin surgery center yearly. advised regular sunscreen use. Denies worrisome, changing, or new skin lesions.  7. Former smoker- quit 2019. 56 years for 1/2 pack per day. Check UA. Enrolled in lung cancer screening program 8. STD screening - declines- not sexually active.   Status of chronic or acute concerns   #Headaches with history subdural hematoma - ongoing issues if sneezes, coughs, bends over, straining- gets pain intermittently with these as triggers. Gets sharp pain in back of head- splitting headache and then headache for about 30 minutes then self resolves. Not as severe as headaches prior to craniotomy from subdural hematoma. Prior CT last year was reassuring and no worsening headaches since that time.  -we opted for a referral to neurosurgeon for their expertise and given prior surgery with them- to see if  they think related to prior surgery and what can be done  #hyperlipidemia/aortic atherosclerosis S: Medication:atorvastatin 20mg   Lab Results  Component Value Date   CHOL 129 08/07/2019   HDL 38 (L) 08/07/2019   LDLCALC 72 08/07/2019   LDLDIRECT 97 05/11/2014   TRIG 103 08/07/2019   CHOLHDL 3.4 08/07/2019   A/P: LDL very close to goal last year- update today- unlikely to increase unless singificant increase  #hypothyroidism S: compliant On thyroid medication-levothyroxine 125 mcg with extra half on sunday  Lab Results  Component Value Date   TSH 3.82 11/27/2019   A/P: Well-controlled last visit-update TSH with labs today and adjust as needed  #allergies- not having to use flonase recently- can refill if needed in future- though hes not sure how helpful it was  #emphysema- noted on chest CT but no breathing issues thankfully  Recommended follow up: Return in about 6 months (around 02/09/2021)  for follow up- or sooner if needed. -if workup with neurosurgery reassuring and you feel well overall could probably stretch to 1 year physical  Lab/Order associations fasting   ICD-10-CM   1. Preventative health care  Z00.00 CBC with Differential/Platelet    Comprehensive metabolic panel    Lipid panel    PSA    TSH    POCT Urinalysis Dipstick (Automated)    Ambulatory referral to Neurosurgery  2. History of subdural hematoma  Z86.79 Ambulatory referral to Neurosurgery  3. History of craniotomy  Z98.890 Ambulatory referral to Neurosurgery  4. Hypothyroidism, unspecified type  E03.9 TSH  5. Dyslipidemia  E78.5 CBC with Differential/Platelet    Comprehensive metabolic panel    Lipid panel  6. Screen for colon cancer  Z12.11 Cologuard  7. Pulmonary emphysema, unspecified emphysema type (HCC) Chronic J43.9   8. Aortic atherosclerosis (HCC) Chronic I70.0   9. Subdural hematoma (HCC) Chronic S06.5X9A   10. Need for immunization against influenza  Z23 Flu Vaccine QUAD High Dose(Fluad)   11. Screening for prostate cancer  Z12.5 PSA  12. Former smoker  Z87.891 POCT Urinalysis Dipstick (Automated)    Meds ordered this encounter  Medications  . atorvastatin (LIPITOR) 20 MG tablet    Sig: Take 1 tablet (20 mg total) by mouth daily.    Dispense:  30 tablet    Refill:  11  . levothyroxine (SYNTHROID) 125 MCG tablet    Sig: TAKE 1 TABLET BY MOUTH ONCE DAILY AND ADDITIONAL 1/2 TABLET ONE DAY A WEEK    Dispense:  34 tablet    Refill:  11    Return precautions advised.  Garret Reddish, MD

## 2020-08-11 NOTE — Addendum Note (Signed)
Addended by: Brandy Hale on: 08/11/2020 10:29 AM   Modules accepted: Orders

## 2020-08-11 NOTE — Patient Instructions (Addendum)
Health Maintenance Due  Topic Date Due  . COVID-19 Vaccine (1) Has not received his vaccine. Please consider this Never done  . INFLUENZA VACCINE In office flu shot today 03/21/2020  . Fecal DNA (Cologuard) - please complete this when you get this 07/24/2020   We will call you within two weeks about your referral to neurosurgery Dr. Kathyrn Sheriff given ongoing headaches and your surgical history. If you do not hear within 3 weeks, give [place] a call.   Please stop by lab before you go If you have mychart- we will send your results within 3 business days of [place] receiving them.  If you do not have mychart- we will call you about results within 5 business days of [place] receiving them.  *please note we are currently using [facility] which has a longer processing time than [place] typically so labs may not come back as quickly as in the past *please also note that you will see labs on mychart as soon as they post. I will later go in and write notes on them- will say "notes from Dr. Yong Channel"   Recommended follow up: Return in about 6 months (around 02/09/2021) for follow up- or sooner if needed.  -if workup with neurosurgery reassuring and you feel well overall could probably stretch to 1 year physical

## 2020-08-12 LAB — TSH: TSH: 1.11 mIU/L (ref 0.40–4.50)

## 2020-08-12 LAB — CBC WITH DIFFERENTIAL/PLATELET
Absolute Monocytes: 289 cells/uL (ref 200–950)
Basophils Absolute: 20 cells/uL (ref 0–200)
Basophils Relative: 0.4 %
Eosinophils Absolute: 123 cells/uL (ref 15–500)
Eosinophils Relative: 2.5 %
HCT: 41.7 % (ref 38.5–50.0)
Hemoglobin: 14.6 g/dL (ref 13.2–17.1)
Lymphs Abs: 1495 cells/uL (ref 850–3900)
MCH: 32.2 pg (ref 27.0–33.0)
MCHC: 35 g/dL (ref 32.0–36.0)
MCV: 91.9 fL (ref 80.0–100.0)
MPV: 10 fL (ref 7.5–12.5)
Monocytes Relative: 5.9 %
Neutro Abs: 2974 cells/uL (ref 1500–7800)
Neutrophils Relative %: 60.7 %
Platelets: 262 10*3/uL (ref 140–400)
RBC: 4.54 10*6/uL (ref 4.20–5.80)
RDW: 12.6 % (ref 11.0–15.0)
Total Lymphocyte: 30.5 %
WBC: 4.9 10*3/uL (ref 3.8–10.8)

## 2020-08-12 LAB — LIPID PANEL
Cholesterol: 139 mg/dL (ref <200)
HDL: 39 mg/dL — ABNORMAL LOW (ref >=40)
LDL Cholesterol (Calc): 77 mg/dL (calc)
Non-HDL Cholesterol (Calc): 100 mg/dL (calc) (ref <130)
Total CHOL/HDL Ratio: 3.6 (calc) (ref <5.0)
Triglycerides: 134 mg/dL (ref <150)

## 2020-08-12 LAB — COMPREHENSIVE METABOLIC PANEL
AG Ratio: 1.6 (calc) (ref 1.0–2.5)
ALT: 24 U/L (ref 9–46)
AST: 13 U/L (ref 10–35)
Albumin: 4.4 g/dL (ref 3.6–5.1)
Alkaline phosphatase (APISO): 45 U/L (ref 35–144)
BUN: 17 mg/dL (ref 7–25)
CO2: 26 mmol/L (ref 20–32)
Calcium: 9.8 mg/dL (ref 8.6–10.3)
Chloride: 104 mmol/L (ref 98–110)
Creat: 0.98 mg/dL (ref 0.70–1.25)
Globulin: 2.8 g/dL (calc) (ref 1.9–3.7)
Glucose, Bld: 102 mg/dL — ABNORMAL HIGH (ref 65–99)
Potassium: 4.1 mmol/L (ref 3.5–5.3)
Sodium: 139 mmol/L (ref 135–146)
Total Bilirubin: 0.5 mg/dL (ref 0.2–1.2)
Total Protein: 7.2 g/dL (ref 6.1–8.1)

## 2020-08-12 LAB — PSA: PSA: 0.88 ng/mL (ref <=4.0)

## 2020-08-17 LAB — COLOGUARD: Cologuard: POSITIVE — AB

## 2020-08-27 LAB — COLOGUARD
COLOGUARD: POSITIVE — AB
Cologuard: POSITIVE — AB

## 2020-08-28 ENCOUNTER — Telehealth: Payer: Self-pay | Admitting: Family Medicine

## 2020-08-28 NOTE — Telephone Encounter (Signed)
Cologuard is positive.  Team this was highlighted on paperwork as if it were abstracted but I cannot see that it is abstracted in the computer (I could be overlooking) -I have placed this on Paxton Kanaan team desk-please abstract this then place a referral to GI under positive Cologuard and inform patient before this is placed

## 2020-08-30 ENCOUNTER — Encounter: Payer: Self-pay | Admitting: Family Medicine

## 2020-08-30 NOTE — Telephone Encounter (Signed)
I left a voicemail with patient pleading for him to at least sit down GI to discuss colonoscopy.  I am hopeful he will change his mind

## 2020-08-30 NOTE — Telephone Encounter (Signed)
This could possibly mean he has colon cancer- I REALLY need him to see GI at least to sit down for consult. Please let me know if he declines- ill need to call him

## 2020-08-30 NOTE — Telephone Encounter (Signed)
Called and spoke with pt and asked if he has a current GI or needed a referral placed and he stated that he wont be needing one b/c he dose not think this is anything of concern,he appreciates your concern but he does not wish to follow up on this or see a GI doc.

## 2020-08-30 NOTE — Telephone Encounter (Signed)
Called and spoke with pt, he still declines need for this.

## 2020-09-06 ENCOUNTER — Ambulatory Visit: Payer: No Typology Code available for payment source

## 2020-09-09 ENCOUNTER — Ambulatory Visit (INDEPENDENT_AMBULATORY_CARE_PROVIDER_SITE_OTHER): Payer: No Typology Code available for payment source

## 2020-09-09 DIAGNOSIS — Z0000 Encounter for general adult medical examination without abnormal findings: Secondary | ICD-10-CM

## 2020-09-09 NOTE — Progress Notes (Signed)
Virtual Visit via Telephone Note  I connected with  Joseph Ford on 09/09/20 at 10:15 AM EST by telephone and verified that I am speaking with the correct person using two identifiers.  Medicare Annual Wellness visit completed telephonically due to Covid-19 pandemic.   Persons participating in this call: This Health Coach and this patient.   Location: Patient: Home Provider: Office   I discussed the limitations, risks, security and privacy concerns of performing an evaluation and management service by telephone and the availability of in person appointments. The patient expressed understanding and agreed to proceed.  Unable to perform video visit due to video visit attempted and failed and/or patient does not have video capability.   Some vital signs may be absent or patient reported.   Willette Brace, LPN    Subjective:   Joseph Ford is a 70 y.o. male who presents for an Initial Medicare Annual Wellness Visit.  Review of Systems     Cardiac Risk Factors include: advanced age (>65men, >25 women);male gender;dyslipidemia     Objective:    Today's Vitals   09/09/20 [id number]  PainSc: 2    There is no height or weight on file to calculate BMI.  Advanced Directives 09/09/2020 07/19/2018 07/16/2018 07/16/2018  Does Patient Have a Medical Advance Directive? Yes - Yes No  Type of Paramedic of [place];Living will Living will;Healthcare Power of Attorney Living will;Healthcare Power of Attorney -  Does patient want to make changes to medical advance directive? - No - Patient declined - -  Copy of [place] in Chart? No - copy requested No - copy requested No - copy requested -  Would patient like information on creating a medical advance directive? - - - No - Patient declined    Current Medications (verified) Outpatient Encounter Medications as of 09/09/2020  Medication Sig  . acetaminophen (TYLENOL) 325 MG tablet Take 650 mg by  mouth every 6 (six) hours as needed.  Marland Kitchen atorvastatin (LIPITOR) 20 MG tablet Take 1 tablet (20 mg total) by mouth daily.  Marland Kitchen levothyroxine (SYNTHROID) 125 MCG tablet TAKE 1 TABLET BY MOUTH ONCE DAILY AND ADDITIONAL 1/2 TABLET ONE DAY A WEEK   No facility-administered encounter medications on file as of 09/09/2020.    Allergies (verified) Patient has no known allergies.   History: Past Medical History:  Diagnosis Date  . Hyperlipidemia   . Hypothyroidism    Past Surgical History:  Procedure Laterality Date  . CRANIOTOMY Left 07/16/2018   Procedure: CRANIOTOMY HEMATOMA EVACUATION SUBDURAL;  Surgeon: Consuella Lose, MD;  Location: [place];  Service: Neurosurgery;  Laterality: Left;  . left eye surgery  1959   at [facility] eyes   Family History  Problem Relation Age of Onset  . Hypertension Mother   . Heart disease Mother 15       died of heart attack  . Heart attack Father   . Heart disease Father 46       "mild heart attack"  . Cancer Sister        breast  . Breast cancer Sister 9  . Diabetes Maternal Aunt   . Diabetes Maternal Grandmother   . Thyroid disease Sister        unremoved, unclear reason   Social History   Socioeconomic History  . Marital status: Single    Spouse name: Not on file  . Number of children: Not on file  . Years of education: Not on file  .  Highest education level: Not on file  Occupational History  . Occupation: retired  Tobacco Use  . Smoking status: Former Smoker    Packs/day: 0.60    Years: 56.00    Pack years: 33.60    Types: Cigarettes    Quit date: 09/05/2017    Years since quitting: 3.0  . Smokeless tobacco: Never Used  Substance and Sexual Activity  . Alcohol use: Yes    Alcohol/week: 1.0 standard drink    Types: 1 Glasses of wine per week  . Drug use: No  . Sexual activity: Not on file  Other Topics Concern  . Not on file  Social History Narrative   Divorced. No children.    Lives with sister and husband who live in  his basement. Has another sister that lives with him in his house.       Retired from post office.       Hobbies: yardwork, Architect work            Scientist, physiological Strain: [place]   . Difficulty of Paying Living Expenses: Not hard at all  Food Insecurity: No Food Insecurity  . Worried About Charity fundraiser in the Last Year: Never true  . Ran Out of Food in the Last Year: Never true  Transportation Needs: No Transportation Needs  . Lack of Transportation (Medical): No  . Lack of Transportation (Non-Medical): No  Physical Activity: Inactive  . Days of Exercise per Week: 0 days  . Minutes of Exercise per Session: 0 min  Stress: No Stress Concern Present  . Feeling of Stress : Not at all  Social Connections: Moderately Integrated  . Frequency of Communication with Friends and Family: More than three times a week  . Frequency of Social Gatherings with Friends and Family: Never  . Attends Religious Services: More than 4 times per year  . Active Member of Clubs or Organizations: Yes  . Attends Archivist Meetings: 1 to 4 times per year  . Marital Status: Never married    Tobacco Counseling Counseling given: Not Answered   Clinical Intake:  Pre-visit preparation completed: Yes  Pain : 0-10 Pain Score: 2  (sore back from fall on ice) Pain Type: Acute pain Pain Location: Back Pain Orientation: Posterior Pain Descriptors / Indicators: Sore Pain Onset: In the past 7 days     BMI - recorded: 27.71 Nutritional Status: BMI 25 -29 Overweight Diabetes: No  How often do you need to have someone help you when you read instructions, pamphlets, or other written materials from your doctor or pharmacy?: 1 - Never  Diabetic?No  Interpreter Needed?: No  Information entered by :: Charlott Rakes, LPN   Activities of Daily Living In your present state of health, do you have any difficulty performing the following activities:  09/09/2020 08/11/2020  Hearing? Y N  Comment mild -  Vision? N N  Difficulty concentrating or making decisions? N N  Walking or climbing stairs? N N  Dressing or bathing? N N  Doing errands, shopping? N N  Preparing Food and eating ? N -  Using the Toilet? N -  In the past six months, have you accidently leaked urine? N -  Do you have problems with loss of bowel control? N -  Managing your Medications? N -  Managing your Finances? N -  Housekeeping or managing your Housekeeping? N -  Some recent data might be hidden  Patient Care Team: Marin Olp, MD as PCP - General (Family Medicine)  Indicate any recent Medical Services you may have received from other than [facility] providers in the past year (date may be approximate).     Assessment:   This is a routine wellness examination for Joseph Ford.  Hearing/Vision screen  Hearing Screening   125Hz  250Hz  500Hz  1000Hz  2000Hz  3000Hz  4000Hz  6000Hz  8000Hz   Right ear:           Left ear:           Comments: Very mild loss noted   Vision Screening Comments: Pt follows up with [facility] with Dr Nicki Reaper  Dietary issues and exercise activities discussed: Current Exercise Habits: The patient does not participate in regular exercise at present  Goals    . Patient Stated     None at this time      Depression Screen PHQ 2/9 Scores 09/09/2020 08/11/2020 08/07/2019 05/07/2018 07/11/2017  PHQ - 2 Score 0 0 0 0 0    Fall Risk Fall Risk  09/09/2020 08/11/2020 08/07/2019 07/11/2017  Falls in the past year? 1 0 0 Yes  Number falls in past yr: 1 0 0 1  Injury with Fall? 1 0 0 No  Comment injured back on ice - - -  Risk for fall due to : Impaired vision - - -  Follow up Falls prevention discussed - - -    FALL RISK PREVENTION PERTAINING TO THE HOME:  Any stairs in or around the home? Yes  If so, are there any without handrails? No  Home free of loose throw rugs in walkways, pet beds, electrical cords, etc? Yes  Adequate  lighting in your home to reduce risk of falls? Yes   ASSISTIVE DEVICES UTILIZED TO PREVENT FALLS:  Life alert? No  Use of a cane, walker or w/c? No  Grab bars in the bathroom? Yes  Shower chair or bench in shower? No  Elevated toilet seat or a handicapped toilet? No   TIMED UP AND GO:  Was the test performed? No .      Cognitive Function:     6CIT Screen 09/09/2020  What Year? 0 points  What month? 0 points  Count back from 20 0 points  Months in reverse 0 points  Repeat phrase 0 points    Immunizations Immunization History  Administered Date(s) Administered  . Fluad Quad(high Dose 65+) 05/06/2019, 08/11/2020  . Influenza, High Dose Seasonal PF 05/07/2018  . Influenza,inj,Quad PF,6+ Mos 05/11/2014, 06/16/2016, 07/11/2017  . Influenza,inj,quad, With Preservative 06/11/2015  . Pneumococcal Conjugate-13 05/07/2018  . Pneumococcal Polysaccharide-23 04/04/2013, 08/07/2019  . Td 09/17/2006  . Tdap 07/11/2017  . Zoster 04/04/2013    TDAP status: Up to date  Flu Vaccine status: Up to date  Done 08/11/20 Pneumococcal vaccine status: Up to date  Covid-19 vaccine status: Declined, Education has been provided regarding the importance of this vaccine but patient still declined. Advised may receive this vaccine at local pharmacy or Health Dept.or vaccine clinic. Aware to provide a copy of the vaccination record if obtained from local pharmacy or Health Dept. Verbalized acceptance and understanding.  Qualifies for Shingles Vaccine? Yes   Zostavax completed Yes   Shingrix Completed?: No.    Education has been provided regarding the importance of this vaccine. Patient has been advised to call insurance company to determine out of pocket expense if they have not yet received this vaccine. Advised may also receive vaccine at local pharmacy or  Health Dept. Verbalized acceptance and understanding.  Screening Tests Health Maintenance  Topic Date Due  . Fecal DNA (Cologuard)   08/28/2023  . TETANUS/TDAP  07/12/2027  . INFLUENZA VACCINE  Completed  . Hepatitis C Screening  Completed  . PNA vac Low Risk Adult  Completed  . COVID-19 Vaccine  Discontinued    Health Maintenance  There are no preventive care reminders to display for this patient.  Colorectal cancer screening: Type of screening: Cologuard. Completed 08/27/20. Repeat every 3 years    Additional Screening:  Hepatitis C Screening:  Completed 07/11/17  Vision Screening: Recommended annual ophthalmology exams for early detection of glaucoma and other disorders of the eye. Is the patient up to date with their annual eye exam?  Yes  Who is the provider or what is the name of the office in which the patient attends annual eye exams? Dr Nicki Reaper   Dental Screening: Recommended annual dental exams for proper oral hygiene  Community Resource Referral / Chronic Care Management: CRR required this visit?  No   CCM required this visit?  No      Plan:     I have personally reviewed and noted the following in the patient's chart:   . Medical and social history . Use of alcohol, tobacco or illicit drugs  . Current medications and supplements . Functional ability and status . Nutritional status . Physical activity . Advanced directives . List of other physicians . Hospitalizations, surgeries, and ER visits in previous 12 months . Vitals . Screenings to include cognitive, depression, and falls . Referrals and appointments  In addition, I have reviewed and discussed with patient certain preventive protocols, quality metrics, and best practice recommendations. A written personalized care plan for preventive services as well as general preventive health recommendations were provided to patient.     Willette Brace, LPN   [id number]   Nurse Notes: None

## 2020-09-09 NOTE — Patient Instructions (Addendum)
Joseph Ford , Thank you for taking time to come for your Medicare Wellness Visit. I appreciate your ongoing commitment to your health goals. Please review the following plan we discussed and let me know if I can assist you in the future.   Screening recommendations/referrals: Colonoscopy: Done 08/27/20 cologurad  Recommended yearly ophthalmology/optometry visit for glaucoma screening and checkup Recommended yearly dental visit for hygiene and checkup  Vaccinations: Influenza vaccine: Done 08/11/20 Up to date Pneumococcal vaccine: Up to date Tdap vaccine: Up to date Shingles vaccine: Shingrix discussed. Pt states declined against provider advice    Covid-19: Declined and discussed  Advanced directives: Please bring a copy of your health care power of attorney and living will to the office at your convenience.  Conditions/risks identified: None at this time  Next appointment: Follow up in one year for your annual wellness visit.   Preventive Care 44 Years and Older, Male Preventive care refers to lifestyle choices and visits with your health care provider that can promote health and wellness. What does preventive care include?  A yearly physical exam. This is also called an annual well check.  Dental exams once or twice a year.  Routine eye exams. Ask your health care provider how often you should have your eyes checked.  Personal lifestyle choices, including:  Daily care of your teeth and gums.  Regular physical activity.  Eating a healthy diet.  Avoiding tobacco and drug use.  Limiting alcohol use.  Practicing safe sex.  Taking low doses of aspirin every day.  Taking vitamin and mineral supplements as recommended by your health care provider. What happens during an annual well check? The services and screenings done by your health care provider during your annual well check will depend on your age, overall health, lifestyle risk factors, and family history of  disease. Counseling  Your health care provider may ask you questions about your:  Alcohol use.  Tobacco use.  Drug use.  Emotional well-being.  Home and relationship well-being.  Sexual activity.  Eating habits.  History of falls.  Memory and ability to understand (cognition).  Work and work Statistician. Screening  You may have the following tests or measurements:  Height, weight, and BMI.  Blood pressure.  Lipid and cholesterol levels. These may be checked every 5 years, or more frequently if you are over 55 years old.  Skin check.  Lung cancer screening. You may have this screening every year starting at age 52 if you have a 30-pack-year history of smoking and currently smoke or have quit within the past 15 years.  Fecal occult blood test (FOBT) of the stool. You may have this test every year starting at age 39.  Flexible sigmoidoscopy or colonoscopy. You may have a sigmoidoscopy every 5 years or a colonoscopy every 10 years starting at age 56.  Prostate cancer screening. Recommendations will vary depending on your family history and other risks.  Hepatitis C blood test.  Hepatitis B blood test.  Sexually transmitted disease (STD) testing.  Diabetes screening. This is done by checking your blood sugar (glucose) after you have not eaten for a while (fasting). You may have this done every 1-3 years.  Abdominal aortic aneurysm (AAA) screening. You may need this if you are a current or former smoker.  Osteoporosis. You may be screened starting at age 77 if you are at high risk. Talk with your health care provider about your test results, treatment options, and if necessary, the need for more tests. Vaccines  Your health care provider may recommend certain vaccines, such as:  Influenza vaccine. This is recommended every year.  Tetanus, diphtheria, and acellular pertussis (Tdap, Td) vaccine. You may need a Td booster every 10 years.  Zoster vaccine. You may  need this after age 54.  Pneumococcal 13-valent conjugate (PCV13) vaccine. One dose is recommended after age 45.  Pneumococcal polysaccharide (PPSV23) vaccine. One dose is recommended after age 101. Talk to your health care provider about which screenings and vaccines you need and how often you need them. This information is not intended to replace advice given to you by your health care provider. Make sure you discuss any questions you have with your health care provider. Document Released: 09/03/2015 Document Revised: 04/26/2016 Document Reviewed: 06/08/2015 Elsevier Interactive Patient Education  2017 [place] Prevention in the Home Falls can cause injuries. They can happen to people of all ages. There are many things you can do to make your home safe and to help prevent falls. What can I do on the outside of my home?  Regularly fix the edges of walkways and driveways and fix any cracks.  Remove anything that might make you trip as you walk through a door, such as a raised step or threshold.  Trim any bushes or trees on the path to your home.  Use bright outdoor lighting.  Clear any walking paths of anything that might make someone trip, such as rocks or tools.  Regularly check to see if handrails are loose or broken. Make sure that both sides of any steps have handrails.  Any raised decks and porches should have guardrails on the edges.  Have any leaves, snow, or ice cleared regularly.  Use sand or salt on walking paths during winter.  Clean up any spills in your garage right away. This includes oil or grease spills. What can I do in the bathroom?  Use night lights.  Install grab bars by the toilet and in the tub and shower. Do not use towel bars as grab bars.  Use non-skid mats or decals in the tub or shower.  If you need to sit down in the shower, use a plastic, non-slip stool.  Keep the floor dry. Clean up any water that spills on the floor as soon as it  happens.  Remove soap buildup in the tub or shower regularly.  Attach bath mats securely with double-sided non-slip rug tape.  Do not have throw rugs and other things on the floor that can make you trip. What can I do in the bedroom?  Use night lights.  Make sure that you have a light by your bed that is easy to reach.  Do not use any sheets or blankets that are too big for your bed. They should not hang down onto the floor.  Have a firm chair that has side arms. You can use this for support while you get dressed.  Do not have throw rugs and other things on the floor that can make you trip. What can I do in the kitchen?  Clean up any spills right away.  Avoid walking on wet floors.  Keep items that you use a lot in easy-to-reach places.  If you need to reach something above you, use a strong step stool that has a grab bar.  Keep electrical cords out of the way.  Do not use floor polish or wax that makes floors slippery. If you must use wax, use non-skid floor wax.  Do  not have throw rugs and other things on the floor that can make you trip. What can I do with my stairs?  Do not leave any items on the stairs.  Make sure that there are handrails on both sides of the stairs and use them. Fix handrails that are broken or loose. Make sure that handrails are as long as the stairways.  Check any carpeting to make sure that it is firmly attached to the stairs. Fix any carpet that is loose or worn.  Avoid having throw rugs at the top or bottom of the stairs. If you do have throw rugs, attach them to the floor with carpet tape.  Make sure that you have a light switch at the top of the stairs and the bottom of the stairs. If you do not have them, ask someone to add them for you. What else can I do to help prevent falls?  Wear shoes that:  Do not have high heels.  Have rubber bottoms.  Are comfortable and fit you well.  Are closed at the toe. Do not wear sandals.  If you  use a stepladder:  Make sure that it is fully opened. Do not climb a closed stepladder.  Make sure that both sides of the stepladder are locked into place.  Ask someone to hold it for you, if possible.  Clearly mark and make sure that you can see:  Any grab bars or handrails.  First and last steps.  Where the edge of each step is.  Use tools that help you move around (mobility aids) if they are needed. These include:  Canes.  Walkers.  Scooters.  Crutches.  Turn on the lights when you go into a dark area. Replace any light bulbs as soon as they burn out.  Set up your furniture so you have a clear path. Avoid moving your furniture around.  If any of your floors are uneven, fix them.  If there are any pets around you, be aware of where they are.  Review your medicines with your doctor. Some medicines can make you feel dizzy. This can increase your chance of falling. Ask your doctor what other things that you can do to help prevent falls. This information is not intended to replace advice given to you by your health care provider. Make sure you discuss any questions you have with your health care provider. Document Released: 06/03/2009 Document Revised: 01/13/2016 Document Reviewed: 09/11/2014 Elsevier Interactive Patient Education  2017 [employer].

## 2021-02-09 NOTE — Addendum Note (Signed)
Addended by: Karle Barr on: 02/09/2021 03:51 PM   Modules accepted: Orders

## 2021-02-17 ENCOUNTER — Ambulatory Visit: Payer: No Typology Code available for payment source | Admitting: Family Medicine

## 2021-03-21 ENCOUNTER — Other Ambulatory Visit: Payer: Self-pay

## 2021-03-21 ENCOUNTER — Ambulatory Visit (INDEPENDENT_AMBULATORY_CARE_PROVIDER_SITE_OTHER)
Admission: RE | Admit: 2021-03-21 | Discharge: 2021-03-21 | Disposition: A | Discharge status: Home / Self Care | Payer: No Typology Code available for payment source | Source: Ambulatory Visit | Attending: Cardiovascular Disease | Admitting: Cardiovascular Disease

## 2021-03-21 DIAGNOSIS — Z87891 Personal history of nicotine dependence: Secondary | ICD-10-CM

## 2021-04-01 NOTE — Progress Notes (Signed)
Please call patient and let them  know their  low dose Ct was read as a Lung RADS 2: nodules that are benign in appearance and behavior with a very low likelihood of becoming a clinically active cancer due to size or lack of growth. Recommendation per radiology is for a repeat LDCT in 12 months. .Please let them  know we will order and schedule their  annual screening scan for 03/2022. Please let them  know there was notation of CAD on their  scan.  Please remind the patient  that this is a non-gated exam therefore degree or severity of disease  cannot be determined. Please have them  follow up with their PCP regarding potential risk factor modification, dietary therapy or pharmacologic therapy if clinically indicated. Pt.  is  currently on statin therapy. Please place order for annual  screening scan for  03/2022 and fax results to PCP. Thanks so much.  Please let patient know there was notation of  Mild coronary artery calcification and Mild atherosclerotic calcification is noted in the wall of the thoracic aorta.He is on statin therapy . Have him follow up with PCP . Thanks so much

## 2021-04-05 ENCOUNTER — Encounter: Payer: Self-pay | Admitting: *Deleted

## 2021-04-05 DIAGNOSIS — Z87891 Personal history of nicotine dependence: Secondary | ICD-10-CM

## 2021-08-05 NOTE — Progress Notes (Signed)
Phone: [phone]   Subjective:  Patient presents today for their annual physical. Chief complaint-noted.   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    Priority: High   Atypical chest pain suspect costochondritis but risk factors concerning 12/31/2014    Priority: High   Former smoker 09/17/2006    Priority: High   Hypothyroidism 12/31/2014    Priority: Medium    Dyslipidemia 09/17/2006    Priority: Medium    Emphysema of lung ([place]) 08/05/2018    Priority: Low   Aortic atherosclerosis ([place]) 07/11/2017    Priority: Low   History of squamous cell carcinoma of skin 10/18/2016    Priority: Low   Hyperglycemia 08/24/2021   Past Surgical History:  Procedure Laterality Date   CRANIOTOMY Left 07/16/2018   Procedure: CRANIOTOMY HEMATOMA EVACUATION SUBDURAL;  Surgeon: Consuella Lose, MD;  Location: [facility] [facility];  Service: Neurosurgery;  Laterality: Left;   left eye surgery  1959   at [facility] eyes    Family History  Problem Relation Age of Onset   Hypertension Mother    Heart disease Mother 57       died of heart attack   Heart attack Father    Heart disease Father 30       "mild heart attack"   Cancer Sister        breast   Breast cancer Sister 53   Diabetes Maternal Aunt    Diabetes Maternal Grandmother    Thyroid disease Sister        unremoved, unclear reason    Medications- reviewed and updated Current Outpatient Medications  Medication Sig Dispense Refill   atorvastatin (LIPITOR) 20 MG tablet Take 1 tablet by mouth once daily 30 tablet 0   levothyroxine (SYNTHROID) 125 MCG tablet TAKE 1  TABLET BY MOUTH ONCE DAILY AND  1/2  TABLET  ONE  DAY  A  WEEK 34 tablet 0   No current facility-administered medications for this visit.    Allergies-reviewed and updated No Known Allergies  Social History   Social History Narrative   Divorced. No children.    Lives with sister and husband who live in his basement. Has another sister that lives with him in his house.       Retired from post office.       Hobbies: yardwork, Architect work            Objective  Objective:  BP 102/64    Pulse 88    Temp 98.4 F (36.9 C)    Ht 5\' 8"  (1.727 m)    Wt 180 lb 9.6 oz (81.9 kg)    SpO2 99%    BMI 27.46 kg/m  Gen: NAD, resting comfortably HEENT: Mucous membranes are moist. Oropharynx normal Neck: no thyromegaly CV: RRR no murmurs rubs or gallops Lungs: CTAB no crackles, wheeze, rhonchi Abdomen: soft/nontender/nondistended/normal bowel sounds. No rebound or guarding.  Ext: no edema Skin: warm, dry Neuro: grossly normal, moves all extremities, PERRLA    Assessment and Plan  70 y.o. male presenting for annual physical.  Health Maintenance counseling: 1. Anticipatory guidance: Patient counseled regarding regular dental exams -q6 months,  eye exams -yearly ,  avoiding smoking and second hand smoke- very sparing 2nd hand smoke- advised to avoid, limiting alcohol to 2 beverages per day -does not drink at all- has not had in several years.    No illicit drugs.  2. Risk factor reduction:  Advised patient of need for regular exercise and diet rich and fruits and vegetables to reduce risk of heart attack and stroke.  Exercise- encouraged 150 minutes of exercise per week again this year -some push mowing during season  Diet/weight management-good job with weight maintenance-actually down 2 pounds from last year - could make some further improvements Wt Readings from Last 3 Encounters:  08/24/21 180 lb 9.6 oz (81.9 kg)  08/11/20 182 lb 3.2 oz (82.6 kg)  08/07/19 178 lb 9.6 oz (81 kg)  3.  Immunizations/screenings/ancillary studies DISCUSSED:  -Flu vaccination (last on 12/21) -today high-dose -Shingrix vaccination #1- recommended at pharmacy in future- technically if still on [facility] only next year and not medicare primary we can do in office -not vaccinated for covid and declines- has not had Immunization History  Administered Date(s) Administered   Fluad Quad(high Dose 65+) 05/06/2019, 08/11/2020, 08/24/2021   Influenza, High Dose Seasonal PF 05/07/2018   Influenza,inj,Quad PF,6+ Mos 05/11/2014, 06/16/2016, 07/11/2017   Influenza,inj,quad, With Preservative 06/11/2015   Pneumococcal Conjugate-13 05/07/2018   Pneumococcal Polysaccharide-23 04/04/2013, 08/07/2019   Td 09/17/2006   Tdap 07/11/2017   Zoster, Live 04/04/2013  4. Prostate cancer screening- will trend PSA with labs. Rectal exam with slight BPH in past. Nocturia once a night stable or none   Lab Results  Component Value Date   PSA 0.88 08/11/2020   PSA 1.0 08/07/2019   PSA 1.4 08/05/2018   5. Colon cancer screening -positive Cologuard 09/06/2020 and was positive prior-we discussed again today that this could mean he has colon cancer-could also mean treatable polyps and strongly recommended colonoscopy- he finally agrees- referral placed  6. Skin cancer screening-skin surgery center yearly. advised regular sunscreen use. Denies worrisome, changing, or new skin lesions.  7. Smoking associated screening (lung cancer screening, AAA screen 65-75, UA)- Former smoker- quit 2019. 56 years for 1/2 pack per day. Check UA. Enrolled in lung cancer screening program last checked August 2022 8. STD screening - declines- not sexually active.   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  In late 2021 pt had ongoing issues if sneezes, coughs, bends over, straining- gets pain intermittently with these  as triggers. Gets sharp pain in back of head- splitting headache and then headache for about 30 minutes then self resolved. Not as severe as headaches prior to craniotomy from subdural hematoma. Prior CT last year was reassured and no worsened headaches since that time.  -we opted for a referral to neurosurgeon for their expertise and given prior surgery with them- to see if they think related to prior surgery and what can be done -He saw neurology last on October 27, 2020-they reported reassuring MRI-they thought intermittent headaches could be related to prior TBI  From MRI on 09/20/20  "IMPRESSION:   1. Post-surgical changes consistent with prior left frontoparietal craniotomy.  2. Subcentimeter focus of gliosis/mild encephalomalacia in the cortex and subcortical white matter of the left middle frontal gyrus, deep to the craniotomy site.  3. Mild smooth left-sided dural thickening and enhancement, consistent with prior craniotomy.  4. No  evidence of acute intracranial abnormality. No subdural fluid collection. "  Thankfully patient states headaches have been very sparing-typically once a week and not as severe.  A/P: prior subdural related to TBI- lingering mild intermittent headaches much better with reassuring MRI last year- continue to monitor    #hyperlipidemia/aortic atherosclerosis with LDL goal under 70 S: Medication:atorvastatin 20mg  daily Lab Results  Component Value Date   CHOL 139 08/11/2020   HDL 39 (L) 08/11/2020   LDLCALC 77 08/11/2020   LDLDIRECT 97 05/11/2014   TRIG 134 08/11/2020   CHOLHDL 3.6 08/11/2020  A/P: hopefully stable or improve-d ideally LDL under 70 - tweak meds if needed -also coronary artery calcium  #hypothyroidism S: compliant On thyroid medication-levothyroxine 125 mcg daily with extra half on sunday . Perhaps mild heat intolerance. Takes extra half tablet one day a week.  Lab Results  Component Value Date   TSH 1.11 08/11/2020  A/P:hopefully stable  (some heath intolerance)- update TSH today. Continue current meds for now   #allergies-  S: Medication: Has required Flonase in the past. Currently allegra A/P: doing well lately with OTC only- continue to monitor   #emphysema- noted on chest CT  Symptoms: perhaps very mild SOB at times  Status and plan: overall stable - declines inhaler for now  # Hyperglycemia/insulin resistance/prediabetes S:  Medication: none A/P: prior cbg slightly high- check a1c  Recommended follow up: Return in about 1 year (around 08/24/2022) for physical or sooner if needed.  Lab/Order associations: fasting   ICD-10-CM   1. Preventative health care  Z00.00 CBC with Differential/Platelet    Comprehensive metabolic panel    Lipid panel    PSA    POCT Urinalysis Dipstick (Automated)    TSH    2. Hyperlipidemia, unspecified hyperlipidemia type  E78.5 CBC with Differential/Platelet    Comprehensive metabolic panel    Lipid panel    3. Aortic atherosclerosis (HCC)  I70.0     4. Hypothyroidism, unspecified type  E03.9 TSH    5. Need for immunization against influenza  Z23 Flu Vaccine QUAD High Dose(Fluad)    6. Screening for prostate cancer  Z12.5 PSA    7. Former smoker  Z87.891 POCT Urinalysis Dipstick (Automated)    8. Positive colorectal cancer screening using Cologuard test  R19.5 Ambulatory referral to Gastroenterology    CANCELED: Ambulatory referral to Gastroenterology    9. Hyperglycemia  R73.9 Hemoglobin A1c      No orders of the defined types were placed in this encounter.   I,Jada Bradford,acting as a scribe for Garret Reddish, MD.,have documented all relevant documentation on the behalf of Garret Reddish, MD,as directed by  Garret Reddish, MD while in the presence of Garret Reddish, MD.   I, Garret Reddish, MD, have reviewed all documentation for this visit. The documentation on 08/24/21 for the exam, diagnosis, procedures, and orders are all accurate and complete.   Return  precautions advised.  Garret Reddish, MD

## 2021-08-20 ENCOUNTER — Other Ambulatory Visit: Payer: Self-pay | Admitting: Family Medicine

## 2021-08-24 ENCOUNTER — Other Ambulatory Visit: Payer: Self-pay

## 2021-08-24 ENCOUNTER — Ambulatory Visit (INDEPENDENT_AMBULATORY_CARE_PROVIDER_SITE_OTHER): Payer: No Typology Code available for payment source | Admitting: Family Medicine

## 2021-08-24 ENCOUNTER — Encounter: Payer: Self-pay | Admitting: Family Medicine

## 2021-08-24 VITALS — BP 102/64 | HR 88 | Temp 98.4°F | Ht 68.0 in | Wt 180.6 lb

## 2021-08-24 DIAGNOSIS — Z125 Encounter for screening for malignant neoplasm of prostate: Secondary | ICD-10-CM

## 2021-08-24 DIAGNOSIS — E785 Hyperlipidemia, unspecified: Secondary | ICD-10-CM

## 2021-08-24 DIAGNOSIS — I700 Atherosclerosis of aorta: Secondary | ICD-10-CM | POA: Diagnosis not present

## 2021-08-24 DIAGNOSIS — Z0000 Encounter for general adult medical examination without abnormal findings: Secondary | ICD-10-CM

## 2021-08-24 DIAGNOSIS — Z23 Encounter for immunization: Secondary | ICD-10-CM | POA: Diagnosis not present

## 2021-08-24 DIAGNOSIS — R319 Hematuria, unspecified: Secondary | ICD-10-CM

## 2021-08-24 DIAGNOSIS — Z87891 Personal history of nicotine dependence: Secondary | ICD-10-CM

## 2021-08-24 DIAGNOSIS — J439 Emphysema, unspecified: Secondary | ICD-10-CM

## 2021-08-24 DIAGNOSIS — E039 Hypothyroidism, unspecified: Secondary | ICD-10-CM | POA: Diagnosis not present

## 2021-08-24 DIAGNOSIS — R195 Other fecal abnormalities: Secondary | ICD-10-CM

## 2021-08-24 DIAGNOSIS — R739 Hyperglycemia, unspecified: Secondary | ICD-10-CM

## 2021-08-24 LAB — POC URINALSYSI DIPSTICK (AUTOMATED)
Bilirubin, UA: NEGATIVE
Blood, UA: POSITIVE
Glucose, UA: NEGATIVE
Ketones, UA: NEGATIVE
Leukocytes, UA: NEGATIVE
Nitrite, UA: NEGATIVE
Protein, UA: NEGATIVE
Spec Grav, UA: 1.015 (ref 1.010–1.025)
Urobilinogen, UA: 0.2 E.U./dL
pH, UA: 5.5 (ref 5.0–8.0)

## 2021-08-24 NOTE — Patient Instructions (Addendum)
Health Maintenance Due  Topic Date Due   Zoster Vaccines- Shingrix (1 of 2)- Hold off shingles shot  at this time. Never done   Thank you for getting your flu shot today in office!   Please try exercising at least 150 minutes a week (30 minutes a day) and work on your dieting and intake more fruits and vegetables. Please consider the half-plate method and watch Dr. Francine Graven    on [place]  Thank you so much for agreeing to do your colonoscopy! Please schedule an appointment for your colonoscopy:  [facility] contact Address: [address], [place], [place] [address] Phone: [phone]  Please stop by lab before you go If you have mychart- we will send your results within 3 business days of [place] receiving them.  If you do not have mychart- we will call you about results within 5 business days of [place] receiving them.  *please also note that you will see labs on mychart as soon as they post. I will later go in and write notes on them- will say "notes from Dr. Yong Channel"  Recommended follow up: Return in about 1 year (around 08/24/2022) for physical or sooner if needed.

## 2021-08-24 NOTE — Addendum Note (Signed)
Addended by: Clyde Lundborg A on: 08/24/2021 02:23 PM   Modules accepted: Orders

## 2021-08-24 NOTE — Addendum Note (Signed)
Addended by: Loura Back on: 08/24/2021 10:09 AM   Modules accepted: Orders

## 2021-08-26 ENCOUNTER — Other Ambulatory Visit: Payer: Self-pay

## 2021-08-26 DIAGNOSIS — E039 Hypothyroidism, unspecified: Secondary | ICD-10-CM

## 2021-08-26 MED ORDER — LEVOTHYROXINE SODIUM 125 MCG PO TABS: ORAL_TABLET | ORAL | 3 refills | Status: DC

## 2021-08-26 MED ORDER — ATORVASTATIN CALCIUM 40 MG PO TABS: 40.0000 mg | ORAL_TABLET | Freq: Every day | ORAL | 3 refills | Status: DC

## 2021-08-27 LAB — CBC WITH DIFFERENTIAL/PLATELET
Absolute Monocytes: 355 cells/uL (ref 200–950)
Basophils Absolute: 21 cells/uL (ref 0–200)
Basophils Relative: 0.4 %
Eosinophils Absolute: 111 cells/uL (ref 15–500)
Eosinophils Relative: 2.1 %
HCT: 41 % (ref 38.5–50.0)
Hemoglobin: 14.2 g/dL (ref 13.2–17.1)
Lymphs Abs: 1654 cells/uL (ref 850–3900)
MCH: 32.1 pg (ref 27.0–33.0)
MCHC: 34.6 g/dL (ref 32.0–36.0)
MCV: 92.8 fL (ref 80.0–100.0)
MPV: 9.6 fL (ref 7.5–12.5)
Monocytes Relative: 6.7 %
Neutro Abs: 3159 cells/uL (ref 1500–7800)
Neutrophils Relative %: 59.6 %
Platelets: 260 10*3/uL (ref 140–400)
RBC: 4.42 10*6/uL (ref 4.20–5.80)
RDW: 12.6 % (ref 11.0–15.0)
Total Lymphocyte: 31.2 %
WBC: 5.3 10*3/uL (ref 3.8–10.8)

## 2021-08-27 LAB — COMPREHENSIVE METABOLIC PANEL
AG Ratio: 1.8 (calc) (ref 1.0–2.5)
ALT: 23 U/L (ref 9–46)
AST: 13 U/L (ref 10–35)
Albumin: 4.5 g/dL (ref 3.6–5.1)
Alkaline phosphatase (APISO): 42 U/L (ref 35–144)
BUN: 16 mg/dL (ref 7–25)
CO2: 27 mmol/L (ref 20–32)
Calcium: 9.9 mg/dL (ref 8.6–10.3)
Chloride: 105 mmol/L (ref 98–110)
Creat: 1.02 mg/dL (ref 0.70–1.35)
Globulin: 2.5 g/dL (calc) (ref 1.9–3.7)
Glucose, Bld: 104 mg/dL — ABNORMAL HIGH (ref 65–99)
Potassium: 4.2 mmol/L (ref 3.5–5.3)
Sodium: 140 mmol/L (ref 135–146)
Total Bilirubin: 0.7 mg/dL (ref 0.2–1.2)
Total Protein: 7 g/dL (ref 6.1–8.1)

## 2021-08-27 LAB — HEMOGLOBIN A1C
Hgb A1c MFr Bld: 5.9 % of total Hgb — ABNORMAL HIGH (ref <5.7)
Mean Plasma Glucose: 123 mg/dL
eAG (mmol/L): 6.8 mmol/L

## 2021-08-27 LAB — LIPID PANEL
Cholesterol: 136 mg/dL (ref <200)
HDL: 36 mg/dL — ABNORMAL LOW (ref >=40)
LDL Cholesterol (Calc): 77 mg/dL (calc)
Non-HDL Cholesterol (Calc): 100 mg/dL (calc) (ref <130)
Total CHOL/HDL Ratio: 3.8 (calc) (ref <5.0)
Triglycerides: 129 mg/dL (ref <150)

## 2021-08-27 LAB — PSA: PSA: 0.78 ng/mL (ref <=4.00)

## 2021-08-27 LAB — TSH: TSH: 0.38 mIU/L — ABNORMAL LOW (ref 0.40–4.50)

## 2021-09-06 ENCOUNTER — Encounter: Payer: Self-pay | Admitting: Internal Medicine

## 2021-09-15 ENCOUNTER — Ambulatory Visit: Payer: No Typology Code available for payment source

## 2021-09-19 ENCOUNTER — Other Ambulatory Visit: Payer: Self-pay | Admitting: Family Medicine

## 2021-09-23 ENCOUNTER — Ambulatory Visit (AMBULATORY_SURGERY_CENTER): Payer: No Typology Code available for payment source | Admitting: *Deleted

## 2021-09-23 ENCOUNTER — Other Ambulatory Visit: Payer: Self-pay

## 2021-09-23 VITALS — Ht 68.0 in | Wt 180.0 lb

## 2021-09-23 DIAGNOSIS — Z1211 Encounter for screening for malignant neoplasm of colon: Secondary | ICD-10-CM

## 2021-09-23 NOTE — Progress Notes (Signed)

## 2021-10-03 ENCOUNTER — Encounter: Payer: Self-pay | Admitting: Internal Medicine

## 2021-10-04 ENCOUNTER — Other Ambulatory Visit: Payer: Self-pay

## 2021-10-04 ENCOUNTER — Ambulatory Visit (AMBULATORY_SURGERY_CENTER): Payer: No Typology Code available for payment source | Admitting: Internal Medicine

## 2021-10-04 ENCOUNTER — Encounter: Payer: Self-pay | Admitting: Internal Medicine

## 2021-10-04 VITALS — BP 118/83 | HR 82 | Temp 98.0°F | Resp 16 | Ht 68.0 in | Wt 180.0 lb

## 2021-10-04 DIAGNOSIS — D123 Benign neoplasm of transverse colon: Secondary | ICD-10-CM

## 2021-10-04 DIAGNOSIS — D124 Benign neoplasm of descending colon: Secondary | ICD-10-CM

## 2021-10-04 DIAGNOSIS — D120 Benign neoplasm of cecum: Secondary | ICD-10-CM

## 2021-10-04 DIAGNOSIS — R195 Other fecal abnormalities: Secondary | ICD-10-CM | POA: Diagnosis not present

## 2021-10-04 MED ORDER — SODIUM CHLORIDE 0.9 % IV SOLN: 500.0000 mL | Freq: Once | INTRAVENOUS | Status: DC

## 2021-10-04 NOTE — Progress Notes (Signed)
AVS locked by Dr Carlean Purl. CN and Dr. Carlean Purl has tried to unlock to AVS with out success. Paper copy of discharge instruction given to patient.

## 2021-10-04 NOTE — Progress Notes (Signed)
Pt non-responsive, VVS, Report to RN  °

## 2021-10-04 NOTE — Op Note (Signed)
[facility] Patient Name: Joseph Ford Procedure Date: 10/04/2021 9:11 AM MRN: [id number] Endoscopist: Gatha Mayer , MD Age: 71 Referring MD:  Date of Birth: [birth year] Gender: Male Account #: [id number] Procedure:                Colonoscopy Indications:              Positive Cologuard test Medicines:                Propofol per Anesthesia, Monitored Anesthesia Care Procedure:                Pre-Anesthesia Assessment:                           - Prior to the procedure, a History and Physical                            was performed, and patient medications and                            allergies were reviewed. The patient's tolerance of                            previous anesthesia was also reviewed. The risks                            and benefits of the procedure and the sedation                            options and risks were discussed with the patient.                            All questions were answered, and informed consent                            was obtained. Prior Anticoagulants: The patient has                            taken no previous anticoagulant or antiplatelet                            agents. ASA Grade Assessment: II - A patient with                            mild systemic disease. After reviewing the risks                            and benefits, the patient was deemed in                            satisfactory condition to undergo the procedure.                           After obtaining informed consent, the colonoscope  was passed under direct vision. Throughout the                            procedure, the patient's blood pressure, pulse, and                            oxygen saturations were monitored continuously. The                            Olympus CF-HQ190L (Serial# [id number]) Colonoscope was                            introduced through the anus and advanced to the the                            cecum,  identified by appendiceal orifice and                            ileocecal valve. The colonoscopy was performed                            without difficulty. The patient tolerated the                            procedure well. The quality of the bowel                            preparation was good. The ileocecal valve,                            appendiceal orifice, and rectum were photographed. Scope In: 9:30:11 AM Scope Out: 9:46:36 AM Scope Withdrawal Time: 0 hours 13 minutes 46 seconds  Total Procedure Duration: 0 hours 16 minutes 25 seconds  Findings:                 A 4 mm polyp was found in the descending colon. The                            polyp was flat. The polyp was removed with a cold                            snare. Resection and retrieval were complete.                            Verification of patient identification for the                            specimen was done. Estimated blood loss was minimal.                           Two sessile polyps were found in the transverse                            colon and  cecum. The polyps were 1 to 2 mm in size.                            These polyps were removed with a cold biopsy                            forceps. Resection and retrieval were complete.                            Verification of patient identification for the                            specimen was done. Estimated blood loss was minimal.                           Multiple diverticula were found in the sigmoid                            colon.                           Internal hemorrhoids were found.                           The perianal exam findings include a perianal rash.                           The digital rectal exam was normal. Estimated Blood Loss:     Estimated blood loss was minimal. Impression:               - One 4 mm polyp in the descending colon, removed                            with a cold snare. Resected and retrieved.                            - Two 1 to 2 mm polyps in the transverse colon and                            in the cecum, removed with a cold biopsy forceps.                            Resected and retrieved.                           - Diverticulosis in the sigmoid colon.                           - Internal hemorrhoids.                           - Perianal rash found on perianal exam. Recommendation:           - Patient has a contact number available for  emergencies. The signs and symptoms of potential                            delayed complications were discussed with the                            patient. Return to normal activities tomorrow.                            Written discharge instructions were provided to the                            patient.                           - Resume previous diet.                           - Continue present medications.                           - Await pathology results. Zinc Oxide to perianal                            rash                           - Repeat colonoscopy is recommended. The                            colonoscopy date will be determined after pathology                            results from today's exam become available for                            review. Gatha Mayer, MD 10/04/2021 9:55:51 AM This report has been signed electronically.

## 2021-10-04 NOTE — Progress Notes (Signed)
[facility] History and Physical   Primary Care Physician:  Marin Olp, MD   Reason for Procedure:  Cologuard +  Plan:    colonoscopy     HPI: Joseph Ford is a 71 y.o. male here for  colonoscopy - 2008 3 left-side hyperplastic polyps so routine screening category Cologuard + 2018 and 2021  Past Medical History:  Diagnosis Date   Allergy    Cataract    forming   COPD (chronic obstructive pulmonary disease) ([place])    per Dr Yong Channel very mild   Emphysema of lung ([place])    mild   GERD (gastroesophageal reflux disease)    occ - pepcid  complete otc prn   Hyperlipidemia    Hypothyroidism     Past Surgical History:  Procedure Laterality Date   COLONOSCOPY  2008   CRANIOTOMY Left 07/16/2018   Procedure: CRANIOTOMY HEMATOMA EVACUATION SUBDURAL;  Surgeon: Consuella Lose, MD;  Location: [place];  Service: Neurosurgery;  Laterality: Left;   left eye surgery  08/21/1957   at [facility] eyes    Prior to Admission medications   Medication Sig Start Date End Date Taking? Authorizing Provider  acetaminophen (TYLENOL) 650 MG CR tablet Take 1,300 mg by mouth daily.   Yes [provider]  atorvastatin (LIPITOR) 40 MG tablet Take 1 tablet (40 mg total) by mouth daily. 08/26/21  Yes Marin Olp, MD  diphenhydrAMINE-APAP, sleep, (TYLENOL PM EXTRA STRENGTH) 50-1000 MG/30ML LIQD Take by mouth at bedtime.   Yes [provider]  fexofenadine (ALLEGRA) 180 MG tablet Take 180 mg by mouth daily.   Yes [provider]  levothyroxine (SYNTHROID) 125 MCG tablet TAKE 1 TABLET BY MOUTH ONCE DAILY 08/26/21  Yes Marin Olp, MD    Current Outpatient Medications  Medication Sig Dispense Refill   acetaminophen (TYLENOL) 650 MG CR tablet Take 1,300 mg by mouth daily.     atorvastatin (LIPITOR) 40 MG tablet Take 1 tablet (40 mg total) by mouth daily. 90 tablet 3   diphenhydrAMINE-APAP, sleep, (TYLENOL PM EXTRA STRENGTH) 50-1000 MG/30ML LIQD  Take by mouth at bedtime.     fexofenadine (ALLEGRA) 180 MG tablet Take 180 mg by mouth daily.     levothyroxine (SYNTHROID) 125 MCG tablet TAKE 1 TABLET BY MOUTH ONCE DAILY 90 tablet 3   Current Facility-Administered Medications  Medication Dose Route Frequency Provider Last Rate Last Admin   0.9 %  sodium chloride infusion  500 mL Intravenous Once Gatha Mayer, MD        Allergies as of 10/04/2021   (No Known Allergies)    Family History  Problem Relation Age of Onset   Hypertension Mother    Heart disease Mother 6       died of heart attack   Heart attack Father    Heart disease Father 70       "mild heart attack"   Cancer Sister        breast   Breast cancer Sister 6   Colon polyps Sister    Thyroid disease Sister        unremoved, unclear reason   Diabetes Maternal Aunt    Diabetes Maternal Grandmother    Colon cancer Neg Hx    Esophageal cancer Neg Hx    Rectal cancer Neg Hx    Stomach cancer Neg Hx     Social History   Socioeconomic History   Marital status: Single    Spouse name: Not on file  Number of children: Not on file   Years of education: Not on file   Highest education level: Not on file  Occupational History   Occupation: retired  Tobacco Use   Smoking status: Former    Packs/day: 0.60    Years: 56.00    Pack years: 33.60    Types: Cigarettes    Quit date: 09/05/2017    Years since quitting: 4.0   Smokeless tobacco: Never  Substance and Sexual Activity   Alcohol use: Not Currently    Alcohol/week: 1.0 standard drink    Types: 1 Glasses of wine per week   Drug use: No      Other Topics Concern     Social History Narrative   Divorced. No children.    Lives with sister and husband who live in his basement. Has another sister that lives with him in his house.       Retired from post office.       Hobbies: yardwork, Architect work               Review of Systems:  All other review of systems negative except as mentioned  in the HPI.  Physical Exam: Vital signs BP 127/88    Pulse 88    Temp 98 F (36.7 C) (Temporal)    Ht 5\' 8"  (1.727 m)    Wt 180 lb (81.6 kg)    SpO2 98%    BMI 27.37 kg/m   General:   Alert,  Well-developed, well-nourished, pleasant and cooperative in NAD Lungs:  Clear throughout to auscultation.   Heart:  Regular rate and rhythm; no murmurs, clicks, rubs,  or gallops. Abdomen:  Soft, nontender and nondistended. Normal bowel sounds.   Neuro/Psych:  Alert and cooperative. Normal mood and affect. A and O x 3   @Joseph Ford  Simonne Maffucci, MD, [facility] [phone] (pager) 10/04/2021 9:18 AM@

## 2021-10-04 NOTE — Progress Notes (Signed)
Called to room to assist during endoscopic procedure.  Patient ID and intended procedure confirmed with present staff. Received instructions for my participation in the procedure from the performing physician.  

## 2021-10-04 NOTE — Progress Notes (Signed)
Pt's states no medical or surgical changes since previsit or office visit.  VS CW  

## 2021-10-06 ENCOUNTER — Telehealth: Payer: Self-pay | Admitting: *Deleted

## 2021-10-06 NOTE — Telephone Encounter (Signed)
°  Follow up Call-  Call back number 10/04/2021  Post procedure Call Back phone  # [phone]  Permission to leave phone message Yes  Some recent data might be hidden     Patient questions:  Do you have a fever, pain , or abdominal swelling? No. Pain Score  0 *  Have you tolerated food without any problems? Yes.    Have you been able to return to your normal activities? Yes.    Do you have any questions about your discharge instructions: Diet   No. Medications  No. Follow up visit  No.  Do you have questions or concerns about your Care? No.  Actions: * If pain score is 4 or above: No action needed, pain <4.

## 2021-10-10 ENCOUNTER — Other Ambulatory Visit: Payer: Self-pay

## 2021-10-10 ENCOUNTER — Other Ambulatory Visit: Payer: No Typology Code available for payment source

## 2021-10-10 DIAGNOSIS — E039 Hypothyroidism, unspecified: Secondary | ICD-10-CM

## 2021-10-10 LAB — TSH: TSH: 1.39 mIU/L (ref 0.40–4.50)

## 2021-10-12 ENCOUNTER — Encounter: Payer: Self-pay | Admitting: Internal Medicine

## 2021-10-12 DIAGNOSIS — Z860101 Personal history of adenomatous and serrated colon polyps: Secondary | ICD-10-CM

## 2021-10-12 DIAGNOSIS — Z86010 Personal history of colonic polyps: Secondary | ICD-10-CM

## 2021-10-12 HISTORY — DX: Personal history of adenomatous and serrated colon polyps: Z86.0101

## 2021-10-12 HISTORY — DX: Personal history of colonic polyps: Z86.010

## 2021-10-19 ENCOUNTER — Other Ambulatory Visit: Payer: Self-pay | Admitting: Family Medicine

## 2021-10-21 ENCOUNTER — Telehealth: Payer: Self-pay | Admitting: Internal Medicine

## 2021-10-21 NOTE — Telephone Encounter (Signed)
Apologies to him, I documented this but did not route it to him. ? ?Please explain the polyps were all benign but precancerous and based upon the tiny size and number a recheck in 5 years is recommended and we have him on the recall list. ?

## 2021-10-21 NOTE — Telephone Encounter (Signed)
Patient called requesting path results ?

## 2021-10-21 NOTE — Telephone Encounter (Signed)
Pt made aware of results and Dr. Carlean Purl Recommendations ?Pt verbalized understanding with all questions answered.  ? ?

## 2021-10-21 NOTE — Telephone Encounter (Signed)
Pt calling and requesting results: I see in the path report documented: ? ?3 diminutive adenomas recall 2028 - 5 years ?My Chart letter ? ?Was there a letter that was created in regard to the path for this procedure:  I am unable to see it: ?Please advise ? ? ?

## 2021-12-07 ENCOUNTER — Telehealth: Payer: Self-pay | Admitting: *Deleted

## 2021-12-07 DIAGNOSIS — R319 Hematuria, unspecified: Secondary | ICD-10-CM

## 2021-12-07 NOTE — Telephone Encounter (Signed)
Spoke to pt told him Urine Micro to check for blood never came back. Dr, Yong Channel would like you to schedule lab appt only to give urine sample. Pt verbalized understanding. Appt scheduled for Monday 4/24 at 9:00 AM. Pt verbalized understanding. Orders put in [place]. ?

## 2021-12-12 ENCOUNTER — Other Ambulatory Visit: Payer: No Typology Code available for payment source

## 2021-12-12 DIAGNOSIS — R319 Hematuria, unspecified: Secondary | ICD-10-CM

## 2021-12-12 NOTE — Addendum Note (Signed)
Addended by: Jodell Cipro on: 12/12/2021 08:43 AM ? ? Modules accepted: Orders ? ?

## 2021-12-13 LAB — URINALYSIS, MICROSCOPIC ONLY
Bacteria, UA: NONE SEEN /HPF
Hyaline Cast: NONE SEEN /LPF
RBC / HPF: NONE SEEN /HPF (ref 0–2)
Squamous Epithelial / HPF: NONE SEEN /HPF (ref <=5)
WBC, UA: NONE SEEN /HPF (ref 0–5)

## 2022-03-21 ENCOUNTER — Ambulatory Visit (HOSPITAL_BASED_OUTPATIENT_CLINIC_OR_DEPARTMENT_OTHER)
Admission: RE | Admit: 2022-03-21 | Discharge: 2022-03-21 | Disposition: A | Discharge status: Home / Self Care | Payer: No Typology Code available for payment source | Source: Ambulatory Visit | Attending: Family Medicine | Admitting: Family Medicine

## 2022-03-21 DIAGNOSIS — I2510 Atherosclerotic heart disease of native coronary artery without angina pectoris: Secondary | ICD-10-CM | POA: Insufficient documentation

## 2022-03-21 DIAGNOSIS — I700 Atherosclerosis of aorta: Secondary | ICD-10-CM | POA: Diagnosis not present

## 2022-03-21 DIAGNOSIS — Z87891 Personal history of nicotine dependence: Secondary | ICD-10-CM | POA: Diagnosis present

## 2022-03-21 DIAGNOSIS — Z122 Encounter for screening for malignant neoplasm of respiratory organs: Secondary | ICD-10-CM | POA: Diagnosis not present

## 2022-03-21 DIAGNOSIS — J439 Emphysema, unspecified: Secondary | ICD-10-CM | POA: Diagnosis not present

## 2022-03-22 ENCOUNTER — Other Ambulatory Visit: Payer: Self-pay | Admitting: Acute Care

## 2022-03-22 DIAGNOSIS — Z87891 Personal history of nicotine dependence: Secondary | ICD-10-CM

## 2022-03-22 DIAGNOSIS — Z122 Encounter for screening for malignant neoplasm of respiratory organs: Secondary | ICD-10-CM

## 2022-05-15 ENCOUNTER — Encounter: Payer: Self-pay | Admitting: *Deleted

## 2022-08-03 ENCOUNTER — Encounter: Payer: Self-pay | Admitting: *Deleted

## 2022-08-18 ENCOUNTER — Other Ambulatory Visit: Payer: Self-pay | Admitting: Family Medicine

## 2022-08-25 ENCOUNTER — Encounter: Payer: Self-pay | Admitting: Family Medicine

## 2022-08-25 ENCOUNTER — Ambulatory Visit (INDEPENDENT_AMBULATORY_CARE_PROVIDER_SITE_OTHER): Payer: No Typology Code available for payment source | Admitting: Family Medicine

## 2022-08-25 VITALS — BP 116/78 | HR 92 | Temp 98.0°F | Resp 16 | Ht 68.0 in | Wt 179.0 lb

## 2022-08-25 DIAGNOSIS — Z87891 Personal history of nicotine dependence: Secondary | ICD-10-CM

## 2022-08-25 DIAGNOSIS — Z0000 Encounter for general adult medical examination without abnormal findings: Secondary | ICD-10-CM

## 2022-08-25 DIAGNOSIS — E785 Hyperlipidemia, unspecified: Secondary | ICD-10-CM | POA: Diagnosis not present

## 2022-08-25 DIAGNOSIS — R351 Nocturia: Secondary | ICD-10-CM

## 2022-08-25 DIAGNOSIS — R739 Hyperglycemia, unspecified: Secondary | ICD-10-CM | POA: Diagnosis not present

## 2022-08-25 DIAGNOSIS — E039 Hypothyroidism, unspecified: Secondary | ICD-10-CM

## 2022-08-25 DIAGNOSIS — J439 Emphysema, unspecified: Secondary | ICD-10-CM

## 2022-08-25 DIAGNOSIS — I700 Atherosclerosis of aorta: Secondary | ICD-10-CM

## 2022-08-25 NOTE — Progress Notes (Signed)
Phone: [phone]   Subjective:  Patient presents today for their annual physical. Chief complaint-noted.   See problem oriented charting- ROS- full  review of systems was completed and negative  except for: intermittent headaches dull but not life limiting, nocturia 0-1x a night  The following were reviewed and entered/updated in epic: Past Medical History:  Diagnosis Date   Allergy    Cataract    forming   COPD (chronic obstructive pulmonary disease) ([place])    per Dr Yong Channel very mild   Emphysema of lung ([place])    mild   GERD (gastroesophageal reflux disease)    occ - pepcid  complete otc prn   Hx of adenomatous colonic polyps 10/12/2021   Hyperlipidemia    Hypothyroidism    Patient Active Problem List   Diagnosis Date Noted   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 07/16/2018    Priority: High   Atypical chest pain suspect costochondritis but risk factors concerning 12/31/2014    Priority: High   Former smoker 09/17/2006    Priority: High   Hyperglycemia 08/24/2021    Priority: Medium    Hypothyroidism 12/31/2014    Priority: Medium    Dyslipidemia 09/17/2006    Priority: Medium    Emphysema of lung ([place]) 08/05/2018    Priority: Low   Aortic atherosclerosis ([place]) 07/11/2017    Priority: Low   History of squamous cell carcinoma of skin 10/18/2016    Priority: Low   Hx of adenomatous colonic polyps 10/12/2021   Past Surgical History:  Procedure Laterality Date   COLONOSCOPY  2008   CRANIOTOMY Left 07/16/2018   Procedure: CRANIOTOMY HEMATOMA EVACUATION SUBDURAL;  Surgeon: Consuella Lose, MD;  Location: [place];  Service: Neurosurgery;  Laterality: Left;   left eye surgery  08/21/1957   at [facility] eyes    Family History  Problem Relation Age of Onset   Hypertension Mother    Heart disease Mother 45       died of heart attack   Heart attack Father    Heart disease Father 30       "mild heart attack"   Breast cancer Sister 80   Thyroid  disease Sister        unremoved, unclear reason   Diabetes Maternal Grandmother    Diabetes Maternal Aunt    Colon cancer Neg Hx    Esophageal cancer Neg Hx    Rectal cancer Neg Hx    Stomach cancer Neg Hx     Medications- reviewed and updated Current Outpatient Medications  Medication Sig Dispense Refill   atorvastatin (LIPITOR) 40 MG tablet Take 1 tablet (40 mg total) by mouth daily. 90 tablet 3   diphenhydrAMINE-APAP, sleep, (TYLENOL PM EXTRA STRENGTH) 50-1000 MG/30ML LIQD Take by mouth at bedtime.     fexofenadine (ALLEGRA) 180 MG tablet Take 180 mg by mouth daily.     levothyroxine (SYNTHROID) 125 MCG tablet Take 1 tablet by mouth once daily 90 tablet 0   No current facility-administered medications for this visit.    Allergies-reviewed and updated No Known Allergies  Social History   Social History Narrative   Divorced. No children.    Lives with sister and husband who live in his basement. Has another sister that lives with him in his house.       Retired from post office.       Hobbies: yardwork, Architect work            Objective  Objective:  BP 116/78 (BP Location: Right Arm, Patient Position: Sitting, Cuff Size: Normal)   Pulse 92   Temp 98 F (36.7 C) (Oral)   Resp 16   Ht '5\' 8"'$  (1.727 m)   Wt 179 lb (81.2 kg)   SpO2 97%   BMI 27.22 kg/m  Gen: NAD, resting comfortably HEENT: Mucous membranes are moist. Oropharynx normal Neck: no thyromegaly CV: RRR no murmurs rubs or gallops Lungs: CTAB no crackles, wheeze, rhonchi Abdomen: soft/nontender/nondistended/normal bowel sounds. No rebound or guarding.  Ext: no edema Skin: warm, dry Neuro: grossly normal, moves all extremities, PERRLA   Assessment and Plan  72 y.o. male presenting for annual physical.  Health Maintenance counseling: 1. Anticipatory guidance: Patient counseled regarding regular dental exams -q6 months, eye exams -yearly,  avoiding smoking and second hand smoke , limiting alcohol  to 2 beverages per day - doesn't drink, no illicit drugs .   2. Risk factor reduction:  Advised patient of need for regular exercise and diet rich and fruits and vegetables to reduce risk of heart attack and stroke.  Exercise- none at present- encouraged walking and strength training  Diet/weight management-within 1 lb of last year- feels could improve- essentially eats what he wants.  Wt Readings from Last 3 Encounters:  08/25/22 179 lb (81.2 kg)  03/21/22 175 lb (79.4 kg)  10/04/21 180 lb (81.6 kg)  3. Immunizations/screenings/ancillary studies-flu shot- opts out this year, Shingrix discussed at pharmacy, discussed RSV, opts out of covid  Immunization History  Administered Date(s) Administered   Fluad Quad(high Dose 65+) 05/06/2019, 08/11/2020, 08/24/2021   Influenza, High Dose Seasonal PF 05/07/2018   Influenza,inj,Quad PF,6+ Mos 05/11/2014, 06/16/2016, 07/11/2017   Influenza,inj,quad, With Preservative 06/11/2015   Pneumococcal Conjugate-13 05/07/2018   Pneumococcal Polysaccharide-23 04/04/2013, 08/07/2019   Td 09/17/2006   Tdap 07/11/2017   Zoster, Live 04/04/2013   4. Prostate cancer screening- low risk prior psa trend- update with labs  Lab Results  Component Value Date   PSA 0.78 08/24/2021   PSA 0.88 08/11/2020   PSA 1.0 08/07/2019   5. Colon cancer screening - has had positive Cologuard which led to colonoscopy on 10/04/2021-polyps were removed with plan for 5-year repeat 6. Skin cancer screening-skin surgery center yearly. advised regular sunscreen use. Denies worrisome, changing, or new skin lesions.  7. Smoking associated screening (lung cancer screening, AAA screen 65-75, UA)-former smoker- quit in 2019.  28 pack years.  Check urinalysis and continue lung cancer screening program 8. 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  Today reports very intermittent  A/P: doing well- continue to monitor   #hyperlipidemia/aortic atherosclerosis with LDL goal under 70 S: Medication:atorvastatin '40mg'$  daily  Lab Results  Component Value Date   CHOL 136 08/24/2021   HDL 36 (L) 08/24/2021   LDLCALC 77 08/24/2021   LDLDIRECT 97 05/11/2014   TRIG 129 08/24/2021   CHOLHDL 3.8 08/24/2021   A/P: if substantial improvement such as LDL 40 or 50- consider reductoin back to 20 mg- could help reduce prediabetes risk -discussed healthy eating and regular exercise could help as well  #hypothyroidism S: compliant On thyroid medication-levothyroxine 125 mcg daily Lab Results  Component Value Date   TSH 1.39 10/10/2021  A/P: hopefully stable- update tsh today. Continue current meds for now   #  allergies S: Medication: Has required Flonase in the past A/P: reasonable control for most part- wants to monitor    #emphysema- noted on chest CT  Symptoms: None Status and plan: As asymptomatic continue to monitor   # Hyperglycemia/insulin resistance/prediabetes S:  Medication: None Exercise and diet-see above Lab Results  Component Value Date   HGBA1C 5.9 (H) 08/24/2021   A/P: Hopefuly stable or improved- update A1c with labs today. Continue without meds for now.  Focus on lifestyle changes   Recommended follow up: Return in about 1 year (around 08/26/2023) for physical or sooner if needed.Schedule b4 you leave.  Lab/Order associations: fasting   ICD-10-CM   1. Preventative health care  Z00.00     2. Hypothyroidism, unspecified type  E03.9 TSH    3. Hyperglycemia  R73.9 HgB A1c    4. Dyslipidemia  E78.5 CBC with Differential/Platelet    Comprehensive metabolic panel    Lipid panel    5. Nocturia  R35.1 PSA    6. Pulmonary emphysema, unspecified emphysema type (HCC) Chronic J43.9     7. Aortic atherosclerosis (HCC) Chronic I70.0      8. Former smoker  Z87.891 Urinalysis, Routine w reflex microscopic      No orders of the defined types were placed in this encounter.   Return precautions advised.  Garret Reddish, MD

## 2022-08-25 NOTE — Addendum Note (Signed)
Addended by: Loura Back on: 08/25/2022 11:28 AM   Modules accepted: Orders

## 2022-08-25 NOTE — Patient Instructions (Addendum)
Health Maintenance Due  Topic Date Due   Zoster Vaccines- Shingrix (1 of 2) Never done  Please check with your pharmacy to see if they have the shingrix vaccine. If they do- please get this immunization and update [place] by phone call or mychart with dates you receive the vaccine  Please stop by lab before you go If you have mychart- we will send your results within 3 business days of [place] receiving them.  If you do not have mychart- we will call you about results within 5 business days of [place] receiving them.  *please also note that you will see labs on mychart as soon as they post. I will later go in and write notes on them- will say "notes from Dr. Yong Channel"    Recommended follow up: Return in about 1 year (around 08/26/2023) for physical or sooner if needed.Schedule b4 you leave.

## 2022-08-26 LAB — CBC WITH DIFFERENTIAL/PLATELET
Absolute Monocytes: 311 cells/uL (ref 200–950)
Basophils Absolute: 41 cells/uL (ref 0–200)
Basophils Relative: 0.8 %
Eosinophils Absolute: 92 cells/uL (ref 15–500)
Eosinophils Relative: 1.8 %
HCT: 41.3 % (ref 38.5–50.0)
Hemoglobin: 14.4 g/dL (ref 13.2–17.1)
Lymphs Abs: 1714 cells/uL (ref 850–3900)
MCH: 31.7 pg (ref 27.0–33.0)
MCHC: 34.9 g/dL (ref 32.0–36.0)
MCV: 91 fL (ref 80.0–100.0)
MPV: 9.8 fL (ref 7.5–12.5)
Monocytes Relative: 6.1 %
Neutro Abs: 2943 cells/uL (ref 1500–7800)
Neutrophils Relative %: 57.7 %
Platelets: 294 10*3/uL (ref 140–400)
RBC: 4.54 10*6/uL (ref 4.20–5.80)
RDW: 12.9 % (ref 11.0–15.0)
Total Lymphocyte: 33.6 %
WBC: 5.1 10*3/uL (ref 3.8–10.8)

## 2022-08-26 LAB — URINALYSIS, ROUTINE W REFLEX MICROSCOPIC
Bilirubin Urine: NEGATIVE
Glucose, UA: NEGATIVE
Hgb urine dipstick: NEGATIVE
Ketones, ur: NEGATIVE
Leukocytes,Ua: NEGATIVE
Nitrite: NEGATIVE
Protein, ur: NEGATIVE
Specific Gravity, Urine: 1.013 (ref 1.001–1.035)
pH: 5.5 (ref 5.0–8.0)

## 2022-08-26 LAB — COMPREHENSIVE METABOLIC PANEL
AG Ratio: 1.8 (calc) (ref 1.0–2.5)
ALT: 21 U/L (ref 9–46)
AST: 10 U/L (ref 10–35)
Albumin: 4.4 g/dL (ref 3.6–5.1)
Alkaline phosphatase (APISO): 49 U/L (ref 35–144)
BUN: 15 mg/dL (ref 7–25)
CO2: 23 mmol/L (ref 20–32)
Calcium: 9.1 mg/dL (ref 8.6–10.3)
Chloride: 105 mmol/L (ref 98–110)
Creat: 0.97 mg/dL (ref 0.70–1.35)
Globulin: 2.5 g/dL (calc) (ref 1.9–3.7)
Glucose, Bld: 99 mg/dL (ref 65–99)
Potassium: 4.6 mmol/L (ref 3.5–5.3)
Sodium: 140 mmol/L (ref 135–146)
Total Bilirubin: 0.4 mg/dL (ref 0.2–1.2)
Total Protein: 6.9 g/dL (ref 6.1–8.1)

## 2022-08-26 LAB — PSA: PSA: 1.23 ng/mL (ref <=4.00)

## 2022-08-26 LAB — EXTRA URINE SPECIMEN

## 2022-08-26 LAB — HEMOGLOBIN A1C
Hgb A1c MFr Bld: 6.3 % of total Hgb — ABNORMAL HIGH (ref <5.7)
Mean Plasma Glucose: 134 mg/dL
eAG (mmol/L): 7.4 mmol/L

## 2022-08-26 LAB — LIPID PANEL
Cholesterol: 117 mg/dL (ref <200)
HDL: 39 mg/dL — ABNORMAL LOW (ref >=40)
LDL Cholesterol (Calc): 62 mg/dL (calc)
Non-HDL Cholesterol (Calc): 78 mg/dL (calc) (ref <130)
Total CHOL/HDL Ratio: 3 (calc) (ref <5.0)
Triglycerides: 77 mg/dL (ref <150)

## 2022-08-26 LAB — TSH: TSH: 1.44 mIU/L (ref 0.40–4.50)

## 2022-11-16 ENCOUNTER — Other Ambulatory Visit: Payer: Self-pay | Admitting: Family Medicine

## 2023-02-12 ENCOUNTER — Other Ambulatory Visit: Payer: Self-pay | Admitting: Family Medicine

## 2023-03-22 ENCOUNTER — Ambulatory Visit
Admission: RE | Admit: 2023-03-22 | Discharge: 2023-03-22 | Disposition: A | Discharge status: Home / Self Care | Payer: No Typology Code available for payment source | Source: Ambulatory Visit | Attending: Acute Care | Admitting: Acute Care

## 2023-03-22 ENCOUNTER — Ambulatory Visit (HOSPITAL_BASED_OUTPATIENT_CLINIC_OR_DEPARTMENT_OTHER): Payer: No Typology Code available for payment source

## 2023-03-22 DIAGNOSIS — Z87891 Personal history of nicotine dependence: Secondary | ICD-10-CM

## 2023-03-22 DIAGNOSIS — Z122 Encounter for screening for malignant neoplasm of respiratory organs: Secondary | ICD-10-CM

## 2023-03-30 ENCOUNTER — Other Ambulatory Visit: Payer: Self-pay | Admitting: Acute Care

## 2023-03-30 DIAGNOSIS — Z87891 Personal history of nicotine dependence: Secondary | ICD-10-CM

## 2023-03-30 DIAGNOSIS — Z122 Encounter for screening for malignant neoplasm of respiratory organs: Secondary | ICD-10-CM

## 2023-05-04 ENCOUNTER — Other Ambulatory Visit: Payer: Self-pay | Admitting: Family Medicine

## 2023-08-27 ENCOUNTER — Encounter: Payer: Self-pay | Admitting: Family Medicine

## 2023-08-27 ENCOUNTER — Ambulatory Visit (INDEPENDENT_AMBULATORY_CARE_PROVIDER_SITE_OTHER): Payer: Commercial Managed Care - PPO | Admitting: Family Medicine

## 2023-08-27 VITALS — BP 130/80 | HR 97 | Temp 98.4°F | Ht 68.0 in | Wt 184.6 lb

## 2023-08-27 DIAGNOSIS — I700 Atherosclerosis of aorta: Secondary | ICD-10-CM

## 2023-08-27 DIAGNOSIS — E785 Hyperlipidemia, unspecified: Secondary | ICD-10-CM

## 2023-08-27 DIAGNOSIS — Z87891 Personal history of nicotine dependence: Secondary | ICD-10-CM

## 2023-08-27 DIAGNOSIS — Z0000 Encounter for general adult medical examination without abnormal findings: Secondary | ICD-10-CM | POA: Diagnosis not present

## 2023-08-27 DIAGNOSIS — R739 Hyperglycemia, unspecified: Secondary | ICD-10-CM

## 2023-08-27 DIAGNOSIS — J439 Emphysema, unspecified: Secondary | ICD-10-CM

## 2023-08-27 DIAGNOSIS — Z125 Encounter for screening for malignant neoplasm of prostate: Secondary | ICD-10-CM

## 2023-08-27 DIAGNOSIS — E039 Hypothyroidism, unspecified: Secondary | ICD-10-CM | POA: Diagnosis not present

## 2023-08-27 DIAGNOSIS — Z131 Encounter for screening for diabetes mellitus: Secondary | ICD-10-CM

## 2023-08-27 DIAGNOSIS — Z8679 Personal history of other diseases of the circulatory system: Secondary | ICD-10-CM

## 2023-08-27 MED ORDER — ATORVASTATIN CALCIUM 40 MG PO TABS: 40.0000 mg | ORAL_TABLET | Freq: Every day | ORAL | 3 refills | Status: DC

## 2023-08-27 NOTE — Patient Instructions (Addendum)
 Please stop by lab before you go If you have mychart- we will send your results within 3 business days of us  receiving them.  If you do not have mychart- we will call you about results within 5 business days of us  receiving them.  *please also note that you will see labs on mychart as soon as they post. I will later go in and write notes on them- will say notes from Dr. Katrinka   Would love to see you exercising regularly with long term goal 150 minutes per week particularly with the prediabetes  Restart cholesterol medicine- suspect #s will be up today- sorry that somehow we didn't get you the medicine that you needed  If area on leg worsens or fails to continue to improve side effects see us  or dermatology   Recommended follow up: Return in about 1 year (around 08/26/2024) for physical or sooner if needed.Schedule b4 you leave.

## 2023-08-27 NOTE — Progress Notes (Signed)
 Phone: [phone]   Subjective:  Patient presents today for their annual physical. Chief complaint-noted.   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 07/16/2018    Priority: High   Atypical chest pain suspect costochondritis but risk factors concerning 12/31/2014    Priority: High   Former smoker 09/17/2006    Priority: High   Hyperglycemia 08/24/2021    Priority: Medium    Hypothyroidism 12/31/2014    Priority: Medium    Dyslipidemia 09/17/2006    Priority: Medium    Emphysema of lung (HCC) 08/05/2018    Priority: Low   Aortic atherosclerosis (HCC) 07/11/2017    Priority: Low   History of squamous cell carcinoma of skin 10/18/2016    Priority: Low   Hx of adenomatous colonic polyps 10/12/2021   Past Surgical History:  Procedure Laterality Date   COLONOSCOPY  2008   CRANIOTOMY Left 07/16/2018   Procedure: CRANIOTOMY HEMATOMA EVACUATION SUBDURAL;  Surgeon: Lanis Pupa, MD;  Location: [facility] OR;  Service: Neurosurgery;  Laterality: Left;   left eye surgery  08/21/1957   at [facility]    Family History  Problem Relation Age of Onset   Hypertension Mother    Heart disease Mother 70       died of heart attack   Heart attack Father    Heart disease Father 67       mild heart attack   Breast cancer Sister 46   Thyroid disease Sister        unremoved, unclear reason    Diabetes Maternal Grandmother    Diabetes Maternal Aunt    Colon cancer Neg Hx    Esophageal cancer Neg Hx    Rectal cancer Neg Hx    Stomach cancer Neg Hx     Medications- reviewed and updated Current Outpatient Medications  Medication Sig Dispense Refill   atorvastatin  (LIPITOR) 40 MG tablet Take 1 tablet (40 mg total) by mouth daily. 90 tablet 3   fexofenadine (ALLEGRA) 180 MG tablet Take 180 mg by mouth daily.     levothyroxine  (SYNTHROID ) 125 MCG tablet Take 1 tablet by mouth once daily 90 tablet 0   diphenhydrAMINE-APAP, sleep, (TYLENOL  PM EXTRA STRENGTH) 50-1000 MG/30ML LIQD Take by mouth at bedtime. (Patient not taking: Reported on 08/27/2023)     No current facility-administered medications for this visit.    Allergies-reviewed and updated No Known Allergies  Social History   Social History Narrative   Divorced. No children.    Lives with sister and husband who live in his basement. Has another sister that lives with him in his house.       Retired from [employer].       Hobbies: yardwork, holiday representative work            Objective  Objective:  BP  130/80   Pulse 97   Temp 98.4 F (36.9 C)   Ht 5' 8 (1.727 m)   Wt 184 lb 9.6 oz (83.7 kg)   SpO2 95%   BMI 28.07 kg/m  Gen: NAD, resting comfortably HEENT: Mucous membranes are moist. Oropharynx normal Neck: no thyromegaly CV: RRR no murmurs rubs or gallops Lungs: CTAB no crackles, wheeze, rhonchi Abdomen: soft/nontender/nondistended/normal bowel sounds. No rebound or guarding.  Ext: no edema Skin: warm, dry Neuro: grossly normal, moves all extremities, PERRLA Declines genitourinary and rectal    Assessment and Plan  73 y.o. male presenting for annual physical.  Health Maintenance counseling: 1. Anticipatory guidance: Patient counseled regarding regular dental exams -q6 months, eye exams -yearly,  avoiding smoking and second hand smoke , limiting alcohol to 2 beverages per day - long term has avoided, no  illicit drugs .   2. Risk factor reduction:  Advised patient of need for regular exercise and diet rich and fruits and vegetables to reduce risk of heart attack and stroke.  Exercise-none last year but we encouraged-encouraged again today.  Diet/weight management-up 5 pounds from last year-encouraged weight loss.  Wt Readings from Last 3 Encounters:  08/27/23 184 lb 9.6 oz (83.7 kg)  08/25/22 179 lb (81.2 kg)  03/21/22 175 lb (79.4 kg)  3. Immunizations/screenings/ancillary studies-Shingrix discussed at pharmacy, declines flu shot, declines COVID vaccination Immunization History  Administered Date(s) Administered   Fluad Quad(high Dose 65+) 05/06/2019, 08/11/2020, 08/24/2021   Influenza, High Dose Seasonal PF 05/07/2018   Influenza,inj,Quad PF,6+ Mos 05/11/2014, 06/16/2016, 07/11/2017   Influenza,inj,quad, With Preservative 06/11/2015   Pneumococcal Conjugate-13 05/07/2018   Pneumococcal Polysaccharide-23 04/04/2013, 08/07/2019   Td 09/17/2006   Tdap 07/11/2017   Zoster, Live 04/04/2013  4. Prostate cancer screening-  low risk prior trend overall (up some last year)- update psa today  Lab Results  Component Value Date   PSA 1.23 08/25/2022   PSA 0.78 08/24/2021   PSA 0.88 08/11/2020   5. Colon cancer screening - 10/04/2021 colonoscopy with 3 diminutive adenomas with recall 2028 per GI 6. Skin cancer screening-skin surgery center yearly. advised regular sunscreen use. Denies worrisome, changing, or new skin lesions.  7. Smoking associated screening (lung cancer screening, AAA screen 65-75, UA)-former smoker-quit smoking in 2019 with 28 pack years.  Check urinalysis and continue lung cancer screening 8. 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  No significant issues since that time-continue monitor- no worsening pattern- remain rare    #hyperlipidemia/aortic atherosclerosis and coronary artery calcifications with LDL goal under 70 S: Medication:atorvastatin  40mg  daily - he has been out for 6 months though Lab Results  Component Value Date   CHOL 117 08/25/2022   HDL 39 (L) 08/25/2022   LDLCALC 62 08/25/2022   LDLDIRECT 97 05/11/2014   TRIG 77 08/25/2022   CHOLHDL 3.0 08/25/2022   A/P: aortic atherosclerosis (presumed stable)- LDL goal ideally <70  - same goal for coronary artery calcium  - at goal last year- update today- not sure why we did not get refill request but he's been out- likely restart after  we see readings- went ahead and sent in today though he can wait on official labs  #hypothyroidism S: compliant On thyroid medication-levothyroxine  125 mcg daily   Lab Results  Component Value Date   TSH 1.44 08/25/2022  A/P:hopefully stable- update levothyroxicine 125 mcg  today. Continue current meds for now    #allergies-  S: Medication: Has required Allegra or Flonase  in the past A/P: mainly allegra lately- doing well    #emphysema- noted on chest CT  Symptoms: None Status and plan: Asymptomatic-continue to monitor  #hyperglycemia/prediabetes- A1c up to 6.3 last year-check another A1c today . Encouraged need for healthy eating, regular exercise, weight loss.   Recommended follow up: Return in about 1 year (around 08/26/2024) for physical or sooner if needed.Schedule b4 you leave.  Lab/Order associations: fasting   ICD-10-CM   1. Preventative health care  Z00.00     2. Dyslipidemia  E78.5     3. Hypothyroidism, unspecified type  E03.9     4. Hyperglycemia  R73.9     5. Screening for diabetes mellitus  Z13.1     6.  Pulmonary emphysema, unspecified emphysema type (HCC)  J43.9     7. Aortic atherosclerosis (HCC)  I70.0     8. 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 Former smoker  Z87.891       No orders of the defined types were placed in this encounter.   Return precautions advised.  Garnette Lukes, MD

## 2023-08-27 NOTE — Addendum Note (Signed)
 Addended by: Lorn Junes on: 08/27/2023 10:52 AM   Modules accepted: Orders

## 2023-08-28 LAB — COMPREHENSIVE METABOLIC PANEL
AG Ratio: 1.6 (calc) (ref 1.0–2.5)
ALT: 21 U/L (ref 9–46)
AST: 15 U/L (ref 10–35)
Albumin: 4.4 g/dL (ref 3.6–5.1)
Alkaline phosphatase (APISO): 49 U/L (ref 35–144)
BUN: 13 mg/dL (ref 7–25)
CO2: 26 mmol/L (ref 20–32)
Calcium: 9.7 mg/dL (ref 8.6–10.3)
Chloride: 105 mmol/L (ref 98–110)
Creat: 1.01 mg/dL (ref 0.70–1.28)
Globulin: 2.8 g/dL (ref 1.9–3.7)
Glucose, Bld: 106 mg/dL — ABNORMAL HIGH (ref 65–99)
Potassium: 4.3 mmol/L (ref 3.5–5.3)
Sodium: 141 mmol/L (ref 135–146)
Total Bilirubin: 0.5 mg/dL (ref 0.2–1.2)
Total Protein: 7.2 g/dL (ref 6.1–8.1)

## 2023-08-28 LAB — LIPID PANEL
Cholesterol: 227 mg/dL — ABNORMAL HIGH (ref <200)
HDL: 37 mg/dL — ABNORMAL LOW (ref >=40)
LDL Cholesterol (Calc): 150 mg/dL — ABNORMAL HIGH
Non-HDL Cholesterol (Calc): 190 mg/dL — ABNORMAL HIGH (ref <130)
Total CHOL/HDL Ratio: 6.1 (calc) — ABNORMAL HIGH (ref <5.0)
Triglycerides: 238 mg/dL — ABNORMAL HIGH (ref <150)

## 2023-08-28 LAB — CBC WITH DIFFERENTIAL/PLATELET
Absolute Lymphocytes: 1725 {cells}/uL (ref 850–3900)
Absolute Monocytes: 350 {cells}/uL (ref 200–950)
Basophils Absolute: 40 {cells}/uL (ref 0–200)
Basophils Relative: 0.8 %
Eosinophils Absolute: 130 {cells}/uL (ref 15–500)
Eosinophils Relative: 2.6 %
HCT: 42.8 % (ref 38.5–50.0)
Hemoglobin: 14.4 g/dL (ref 13.2–17.1)
MCH: 31.1 pg (ref 27.0–33.0)
MCHC: 33.6 g/dL (ref 32.0–36.0)
MCV: 92.4 fL (ref 80.0–100.0)
MPV: 9.6 fL (ref 7.5–12.5)
Monocytes Relative: 7 %
Neutro Abs: 2755 {cells}/uL (ref 1500–7800)
Neutrophils Relative %: 55.1 %
Platelets: 306 10*3/uL (ref 140–400)
RBC: 4.63 10*6/uL (ref 4.20–5.80)
RDW: 12.7 % (ref 11.0–15.0)
Total Lymphocyte: 34.5 %
WBC: 5 10*3/uL (ref 3.8–10.8)

## 2023-08-28 LAB — URINALYSIS, ROUTINE W REFLEX MICROSCOPIC
Bilirubin Urine: NEGATIVE
Glucose, UA: NEGATIVE
Hgb urine dipstick: NEGATIVE
Ketones, ur: NEGATIVE
Leukocytes,Ua: NEGATIVE
Nitrite: NEGATIVE
Protein, ur: NEGATIVE
Specific Gravity, Urine: 1.011 (ref 1.001–1.035)
pH: <=5 (ref 5.0–8.0)

## 2023-08-28 LAB — HEMOGLOBIN A1C
Hgb A1c MFr Bld: 5.9 %{Hb} — ABNORMAL HIGH (ref <5.7)
Mean Plasma Glucose: 123 mg/dL
eAG (mmol/L): 6.8 mmol/L

## 2023-08-28 LAB — PSA: PSA: 0.93 ng/mL (ref <=4.00)

## 2023-08-28 LAB — TSH: TSH: 1.15 m[IU]/L (ref 0.40–4.50)

## 2023-10-12 ENCOUNTER — Other Ambulatory Visit: Payer: Self-pay | Admitting: Family Medicine

## 2024-01-09 ENCOUNTER — Other Ambulatory Visit: Payer: Self-pay | Admitting: Family Medicine

## 2024-03-24 ENCOUNTER — Ambulatory Visit
Admission: RE | Admit: 2024-03-24 | Discharge: 2024-03-24 | Disposition: A | Discharge status: Home / Self Care | Source: Ambulatory Visit | Attending: Acute Care | Admitting: Acute Care

## 2024-03-24 DIAGNOSIS — Z122 Encounter for screening for malignant neoplasm of respiratory organs: Secondary | ICD-10-CM

## 2024-03-24 DIAGNOSIS — Z87891 Personal history of nicotine dependence: Secondary | ICD-10-CM

## 2024-04-04 ENCOUNTER — Other Ambulatory Visit: Payer: Self-pay | Admitting: Family Medicine

## 2024-04-07 ENCOUNTER — Other Ambulatory Visit: Payer: Self-pay | Admitting: Acute Care

## 2024-04-07 DIAGNOSIS — Z87891 Personal history of nicotine dependence: Secondary | ICD-10-CM

## 2024-04-07 DIAGNOSIS — Z122 Encounter for screening for malignant neoplasm of respiratory organs: Secondary | ICD-10-CM

## 2024-06-29 ENCOUNTER — Other Ambulatory Visit: Payer: Self-pay | Admitting: Family Medicine

## 2024-08-06 ENCOUNTER — Other Ambulatory Visit: Payer: Self-pay | Admitting: Family Medicine

## 2024-08-27 ENCOUNTER — Encounter: Payer: Self-pay | Admitting: Family Medicine

## 2024-08-27 ENCOUNTER — Ambulatory Visit: Payer: Commercial Managed Care - PPO | Admitting: Family Medicine

## 2024-08-27 VITALS — BP 128/72 | HR 78 | Temp 98.1°F | Ht 68.0 in | Wt 180.8 lb

## 2024-08-27 DIAGNOSIS — E785 Hyperlipidemia, unspecified: Secondary | ICD-10-CM

## 2024-08-27 DIAGNOSIS — Z23 Encounter for immunization: Secondary | ICD-10-CM

## 2024-08-27 DIAGNOSIS — Z87891 Personal history of nicotine dependence: Secondary | ICD-10-CM | POA: Diagnosis not present

## 2024-08-27 DIAGNOSIS — E039 Hypothyroidism, unspecified: Secondary | ICD-10-CM | POA: Diagnosis not present

## 2024-08-27 DIAGNOSIS — R739 Hyperglycemia, unspecified: Secondary | ICD-10-CM | POA: Diagnosis not present

## 2024-08-27 DIAGNOSIS — Z131 Encounter for screening for diabetes mellitus: Secondary | ICD-10-CM

## 2024-08-27 DIAGNOSIS — Z125 Encounter for screening for malignant neoplasm of prostate: Secondary | ICD-10-CM

## 2024-08-27 DIAGNOSIS — Z0000 Encounter for general adult medical examination without abnormal findings: Secondary | ICD-10-CM | POA: Diagnosis not present

## 2024-08-27 DIAGNOSIS — J439 Emphysema, unspecified: Secondary | ICD-10-CM

## 2024-08-27 NOTE — Progress Notes (Signed)
 " Phone: [phone]   Subjective:  Patient presents today for their annual physical. Chief complaint-noted.   See problem oriented charting- ROS- full  review of systems was completed and negative  except for topics noted under acute/chronic concerns  The following were reviewed and entered/updated in epic: Past Medical History:  Diagnosis Date   Allergy    Cataract    forming   COPD (chronic obstructive pulmonary disease) (HCC)    per Dr Katrinka very mild   Emphysema of lung (HCC)    mild   GERD (gastroesophageal reflux disease)    occ - pepcid  complete otc prn   Hx of adenomatous colonic polyps 10/12/2021   Hyperlipidemia    Hypothyroidism    Patient Active Problem List   Diagnosis Date Noted   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 07/16/2018    Priority: High   Atypical chest pain suspect costochondritis but risk factors concerning 12/31/2014    Priority: High   Former smoker 09/17/2006    Priority: High   Hyperglycemia 08/24/2021    Priority: Medium    Hypothyroidism 12/31/2014    Priority: Medium    Dyslipidemia 09/17/2006    Priority: Medium    Emphysema of lung (HCC) 08/05/2018    Priority: Low   Aortic atherosclerosis 07/11/2017    Priority: Low   History of squamous cell carcinoma of skin 10/18/2016    Priority: Low   Hx of adenomatous colonic polyps 10/12/2021   Past Surgical History:  Procedure Laterality Date   COLONOSCOPY  2008   CRANIOTOMY Left 07/16/2018   Procedure: CRANIOTOMY HEMATOMA EVACUATION SUBDURAL;  Surgeon: Lanis Pupa, MD;  Location: [facility] OR;  Service: Neurosurgery;  Laterality: Left;   left eye surgery  08/21/1957   at [facility]    Family History  Problem Relation Age of Onset   Hypertension Mother    Heart disease Mother 30       died of heart attack   Heart attack Father    Heart disease Father 26       mild heart attack   Breast cancer Sister 82   Thyroid disease Sister        unremoved,  unclear reason   Diabetes Maternal Grandmother    Diabetes Maternal Aunt    Colon cancer Neg Hx    Esophageal cancer Neg Hx    Rectal cancer Neg Hx    Stomach cancer Neg Hx     Medications- reviewed and updated Current Outpatient Medications  Medication Sig Dispense Refill   atorvastatin  (LIPITOR) 40 MG tablet Take 1 tablet by mouth once daily 90 tablet 0   fexofenadine (ALLEGRA) 180 MG tablet Take 180 mg by mouth daily.     levothyroxine  (SYNTHROID ) 125 MCG tablet Take 1 tablet by mouth once daily 90 tablet 0   diphenhydrAMINE-APAP, sleep, (TYLENOL  PM EXTRA STRENGTH) 50-1000 MG/30ML LIQD Take by mouth at bedtime. (Patient not taking: Reported on 08/27/2024)     No current facility-administered medications for this visit.    Allergies-reviewed and updated Allergies[1]  Social History   Social History Narrative   Divorced. No children.    Lives with sister and husband who live in his basement. Has another sister that lives with him in his house.       Retired from [employer].       Hobbies: yardwork, holiday representative work            Objective  Objective:  BP 128/72 (BP Location:  Left Arm, Patient Position: Sitting, Cuff Size: Normal)   Pulse 78   Temp 98.1 F (36.7 C) (Temporal)   Ht 5' 8 (1.727 m)   Wt 180 lb 12.8 oz (82 kg)   SpO2 97%   BMI 27.49 kg/m  Gen: NAD, resting comfortably HEENT: Mucous membranes are moist. Oropharynx normal Neck: no thyromegaly CV: RRR no murmurs rubs or gallops Lungs: CTAB no crackles, wheeze, rhonchi Abdomen: soft/nontender/nondistended/normal bowel sounds. No rebound or guarding.  Ext: no edema Skin: warm, dry Neuro: grossly normal, moves all extremities, PERRLA    Assessment and Plan  74 y.o. male presenting for annual physical.  Health Maintenance counseling: 1. Anticipatory guidance: Patient counseled regarding regular dental exams -q6 months, eye exams -yearly,  avoiding smoking and second hand smoke- quit 2019 , limiting  alcohol to 2 beverages per day - no alcohol in many years, no illicit drugs .   2. Risk factor reduction:  Advised patient of need for regular exercise and diet rich and fruits and vegetables to reduce risk of heart attack and stroke.  Exercise-no regular exercise-encouraged again today to start.  Diet/weight management-weight down 4 pounds from last year-typically fluctuates around this range.  Wt Readings from Last 3 Encounters:  08/27/24 180 lb 12.8 oz (82 kg)  08/27/23 184 lb 9.6 oz (83.7 kg)  08/25/22 179 lb (81.2 kg)  3. Immunizations/screenings/ancillary studies-first Shingrix  today and discussed repeat in 2 to 6 months.  Declines flu shot  Immunization History  Administered Date(s) Administered   Fluad Quad(high Dose 65+) 05/06/2019, 08/11/2020, 08/24/2021   INFLUENZA, HIGH DOSE SEASONAL PF 05/07/2018   Influenza,inj,Quad PF,6+ Mos 05/11/2014, 06/16/2016, 07/11/2017   Influenza,inj,quad, With Preservative 06/11/2015   Pneumococcal Conjugate-13 05/07/2018   Pneumococcal Polysaccharide-23 04/04/2013, 08/07/2019   Td 09/17/2006   Tdap 07/11/2017   Zoster Recombinant(Shingrix ) 08/27/2024   Zoster, Live 04/04/2013  4. Prostate cancer screening-  low risk prior trend- update psa today  Lab Results  Component Value Date   PSA 0.93 08/27/2023   PSA 1.23 08/25/2022   PSA 0.78 08/24/2021   5. Colon cancer screening - 10/04/2021 colonoscopy with 3 diminutive adenomas with recall 2028 per gastroenterology for 5 year repeat  6. Skin cancer screening-skin surgery center yearly.  advised regular sunscreen use. Denies worrisome, changing, or new skin lesions.  7. Smoking associated screening (lung cancer screening, AAA screen 65-75, UA)-former smoker-quit smoking in 2019 with 28 pack years.  Check urinalysis and continue lung cancer screening.  No AAA in 2019  8. STD screening - opts out-not dating   Status of chronic or acute concerns   #Headaches with history subdural hematoma related to  TBI per neurosurgery in 2022 after prior subdural hematoma with surgery in 2019-complaint of worsening pattern- thankfully tylenol  or advil helps    #hyperlipidemia/aortic atherosclerosis and coronary artery calcifications with LDL goal under 70 S: Medication:atorvastatin  40mg  daily  Lab Results  Component Value Date   CHOL 227 (H) 08/27/2023   HDL 37 (L) 08/27/2023   LDLCALC 150 (H) 08/27/2023   LDLDIRECT 97 05/11/2014   TRIG 238 (H) 08/27/2023   CHOLHDL 6.1 (H) 08/27/2023  A/P: Last year was off statin and #s up-hopeful for significant improvement today back on medicine- continue current medications for now   #hypothyroidism S: compliant On thyroid medication-levothyroxine  125 mcg daily  A/P:hopefully stable- update tsh today. Continue current meds for now    #allergies-  S: Medication: Has required Allegra  still A/P: working ok- continue current medications     #  emphysema- noted on chest CT  Symptoms: minimal mild shortness of breath at times such as push mower on hills Status and plan: Asymptomatic-continue to monitor without medicine-thankfully quit smoking years ago   # Hyperglycemia/insulin resistance/prediabetes S:  Medication: None Lab Results  Component Value Date   HGBA1C 5.9 (H) 08/27/2023   HGBA1C 6.3 (H) 08/25/2022   HGBA1C 5.9 (H) 08/24/2021   A/P: Numbers improved last year-update again today  Recommended follow up: Return in about 1 year (around 08/27/2025) for physical or sooner if needed.Schedule b4 you leave.  Lab/Order associations: fasting   ICD-10-CM   1. Preventative health care  Z00.00     2. Immunization due  Z23 Varicella-zoster vaccine IM (Shingrix )    3. Dyslipidemia  E78.5     4. Hyperglycemia  R73.9     5. Screening for diabetes mellitus  Z13.1     6. Hypothyroidism, unspecified type  E03.9     7. Screening for prostate cancer  Z12.5     8. Pulmonary emphysema (HCC)  J43.9       No orders of the defined types were placed in this  encounter.   Return precautions advised.  Garnette Lukes, MD      [1] No Known Allergies  "

## 2024-08-27 NOTE — Patient Instructions (Addendum)
 Please stop by lab before you go - we have requested all labs go to [facility] If you have mychart- we will send your results within 3 business days of us  receiving them.  If you do not have mychart- we will call you about results within 5 business days of us  receiving them.  *please also note that you will see labs on mychart as soon as they post. I will later go in and write notes on them- will say notes from Dr. Katrinka   Shingrix  #1 today. Repeat injection in 2-6 months. Schedule a nurse visit for the 2nd injection before you leave today (at the check out desk)   Recommended follow up: Return in about 1 year (around 08/27/2025) for physical or sooner if needed.Schedule b4 you leave.

## 2024-08-28 ENCOUNTER — Ambulatory Visit: Payer: Self-pay | Admitting: Family Medicine

## 2024-08-29 LAB — URINALYSIS, ROUTINE W REFLEX MICROSCOPIC
Bacteria, UA: NONE SEEN /HPF
Bilirubin Urine: NEGATIVE
Glucose, UA: NEGATIVE
Hgb urine dipstick: NEGATIVE
Hyaline Cast: NONE SEEN /LPF
Ketones, ur: NEGATIVE
Leukocytes,Ua: NEGATIVE
Nitrite: NEGATIVE
Protein, ur: NEGATIVE
RBC / HPF: NONE SEEN /HPF (ref 0–2)
Specific Gravity, Urine: 1.007 (ref 1.001–1.035)
Squamous Epithelial / HPF: NONE SEEN /HPF
WBC, UA: NONE SEEN /HPF (ref 0–5)
pH: 5.5 (ref 5.0–8.0)

## 2024-08-29 LAB — CBC WITH DIFFERENTIAL/PLATELET
Absolute Lymphocytes: 1887 {cells}/uL (ref 850–3900)
Absolute Monocytes: 281 {cells}/uL (ref 200–950)
Basophils Absolute: 41 {cells}/uL (ref 0–200)
Basophils Relative: 0.8 %
Eosinophils Absolute: 128 {cells}/uL (ref 15–500)
Eosinophils Relative: 2.5 %
HCT: 43 % (ref 39.4–51.1)
Hemoglobin: 14.5 g/dL (ref 13.2–17.1)
MCH: 31.7 pg (ref 27.0–33.0)
MCHC: 33.7 g/dL (ref 31.6–35.4)
MCV: 94.1 fL (ref 81.4–101.7)
MPV: 9.8 fL (ref 7.5–12.5)
Monocytes Relative: 5.5 %
Neutro Abs: 2764 {cells}/uL (ref 1500–7800)
Neutrophils Relative %: 54.2 %
Platelets: 267 Thousand/uL (ref 140–400)
RBC: 4.57 Million/uL (ref 4.20–5.80)
RDW: 12.8 % (ref 11.0–15.0)
Total Lymphocyte: 37 %
WBC: 5.1 Thousand/uL (ref 3.8–10.8)

## 2024-08-29 LAB — LIPID PANEL
Cholesterol: 122 mg/dL
HDL: 41 mg/dL
LDL Cholesterol (Calc): 61 mg/dL
Non-HDL Cholesterol (Calc): 81 mg/dL
Total CHOL/HDL Ratio: 3 (calc)
Triglycerides: 115 mg/dL

## 2024-08-29 LAB — COMPREHENSIVE METABOLIC PANEL WITH GFR
AG Ratio: 1.7 (calc) (ref 1.0–2.5)
ALT: 27 U/L (ref 9–46)
AST: 14 U/L (ref 10–35)
Albumin: 4.5 g/dL (ref 3.6–5.1)
Alkaline phosphatase (APISO): 52 U/L (ref 35–144)
BUN: 18 mg/dL (ref 7–25)
CO2: 27 mmol/L (ref 20–32)
Calcium: 9.4 mg/dL (ref 8.6–10.3)
Chloride: 104 mmol/L (ref 98–110)
Creat: 0.95 mg/dL (ref 0.70–1.28)
Globulin: 2.7 g/dL (ref 1.9–3.7)
Glucose, Bld: 106 mg/dL — ABNORMAL HIGH (ref 65–99)
Potassium: 4.2 mmol/L (ref 3.5–5.3)
Sodium: 139 mmol/L (ref 135–146)
Total Bilirubin: 0.6 mg/dL (ref 0.2–1.2)
Total Protein: 7.2 g/dL (ref 6.1–8.1)
eGFR: 86 mL/min/1.73m2

## 2024-08-29 LAB — HEMOGLOBIN A1C
Hgb A1c MFr Bld: 5.9 % — ABNORMAL HIGH
Mean Plasma Glucose: 123 mg/dL
eAG (mmol/L): 6.8 mmol/L

## 2024-08-29 LAB — EXTRA URINE SPECIMEN

## 2024-08-29 LAB — TSH: TSH: 1.85 m[IU]/L (ref 0.40–4.50)

## 2024-08-29 LAB — PSA: PSA: 0.83 ng/mL

## 2024-10-28 ENCOUNTER — Ambulatory Visit

## 2025-08-28 ENCOUNTER — Encounter: Admitting: Family Medicine
